# Patient Record
Sex: Male | Born: 1970 | Race: Black or African American | Hispanic: No | Smoking: Current every day smoker
Health system: Southern US, Community
[De-identification: ages and names within clinical notes are randomized; demographics above are authoritative.]

## PROBLEM LIST (undated history)

## (undated) DIAGNOSIS — K219 Gastro-esophageal reflux disease without esophagitis: Secondary | ICD-10-CM

## (undated) HISTORY — DX: Gastro-esophageal reflux disease without esophagitis: K21.9

## (undated) HISTORY — PX: NO PAST SURGERIES: SHX2092

---

## 1998-09-23 ENCOUNTER — Emergency Department (HOSPITAL_COMMUNITY): Admission: EM | Admit: 1998-09-23 | Discharge: 1998-09-23 | Payer: Self-pay | Admitting: Emergency Medicine

## 2006-05-06 ENCOUNTER — Ambulatory Visit: Payer: Self-pay | Admitting: Internal Medicine

## 2006-05-23 ENCOUNTER — Ambulatory Visit: Payer: Self-pay | Admitting: Gastroenterology

## 2006-06-22 ENCOUNTER — Ambulatory Visit: Payer: Self-pay | Admitting: Internal Medicine

## 2006-06-22 LAB — CBC & DIFF AND RETIC
BASO%: 1.5 % (ref 0.0–2.0)
Eosinophils Absolute: 0.3 10*3/uL (ref 0.0–0.5)
HCT: 43.6 % (ref 38.7–49.9)
LYMPH%: 43.7 % (ref 14.0–48.0)
MCHC: 34.4 g/dL (ref 32.0–35.9)
MCV: 74.4 fL — ABNORMAL LOW (ref 81.6–98.0)
MONO#: 0.5 10*3/uL (ref 0.1–0.9)
NEUT%: 46.4 % (ref 40.0–75.0)
Platelets: 282 10*3/uL (ref 145–400)
WBC: 8.9 10*3/uL (ref 4.0–10.0)

## 2006-06-24 LAB — HEMOGLOBINOPATHY EVALUATION
Hemoglobin Other: 0 % (ref 0.0–0.0)
Hgb A2 Quant: 2.8 % (ref 2.2–3.2)
Hgb F Quant: 0 % (ref 0.0–0.5)

## 2006-06-24 LAB — COMPREHENSIVE METABOLIC PANEL
Albumin: 4.5 g/dL (ref 3.5–5.2)
CO2: 25 mEq/L (ref 19–32)
Glucose, Bld: 96 mg/dL (ref 70–99)
Potassium: 3.9 mEq/L (ref 3.5–5.3)
Sodium: 142 mEq/L (ref 135–145)
Total Protein: 7.2 g/dL (ref 6.0–8.3)

## 2006-06-24 LAB — LACTATE DEHYDROGENASE: LDH: 114 U/L (ref 94–250)

## 2006-06-24 LAB — PROTEIN ELECTROPHORESIS, SERUM
Alpha-1-Globulin: 3.4 % (ref 2.9–4.9)
Alpha-2-Globulin: 10.4 % (ref 7.1–11.8)
Beta 2: 3.6 % (ref 3.2–6.5)
Gamma Globulin: 16.8 % (ref 11.1–18.8)

## 2006-06-24 LAB — IRON AND TIBC: Iron: 83 ug/dL (ref 42–165)

## 2009-03-24 ENCOUNTER — Emergency Department (HOSPITAL_COMMUNITY): Admission: EM | Admit: 2009-03-24 | Discharge: 2009-03-24 | Payer: Self-pay | Admitting: Emergency Medicine

## 2010-07-10 NOTE — Letter (Signed)
May 23, 2006     RE:  Earl, Levine  MRN:  [id number]  /  DOB:  [birth year]   Dear Mr. Earl Levine:   It is my pleasure to have treated you recently as a new patient in my  office.  I appreciate your confidence and the opportunity to participate  in your care.   Since I do have a busy inpatient endoscopy schedule and office schedule,  my office hours vary weekly.  I am, however, available for emergency  calls every day through my office.  If I cannot promptly meet an urgent  office appointment, another one of our gastroenterologists will be able  to assist you.   My well-trained staff are prepared to help you at all times.  For  emergencies after office hours, a physician from our gastroenterology  section is always available through my 24-hour answering service.   While you are under my care, I encourage discussion of your questions  and concerns, and I will be happy to return your calls as soon as I am  available.   Once again, I welcome you as a new patient and I look forward to a happy  and healthy relationship.    Sincerely,      Barbette Hair. [name], MD,FACG  Electronically Signed    RDK/MedQ  DD: 05/23/2006  DT: 05/23/2006  Job #: [id number]

## 2010-07-10 NOTE — Assessment & Plan Note (Signed)
[facility]                         GASTROENTEROLOGY OFFICE NOTE   Earl Levine                     MRN:          [id number]  DATE:05/23/2006                            DOB:          [birth year]    PROBLEM:  Abdominal pain.   Mr. Earl Levine is a 40 year old African male referred through the  courtesy of Dr. Alwyn Levine for evaluation.  About 4-6 weeks ago he had  burning abdominal pain both in the left and right upper quadrants.  He  was treated with AcipHex with some improvement.  Symptoms have recurred  since stopping his AcipHex.  Lab work on April 16, 2006, was  pertinent for amylase of 127 and lipase of 108.  Liver tests were  normal.  Abdominal ultrasound demonstrated a gallbladder polyp only.  He  continues to complain of burning discomfort in the left and right upper  quadrants.  It is unrelated to eating.  He is on no gastric irritants  including nonsteroidals or alcohol.  Urinalysis performed in the past  was normal.  Repeat amylase and lipase on May 07, 2006, were normal.   PAST MEDICAL HISTORY:  Unremarkable.   FAMILY HISTORY:  Noncontributory.   MEDICATIONS:  His only medication is Flexeril.  He has no known  allergies.   He does smoke, he does not drink.  He is single and works at [employer].   REVIEW OF SYSTEMS:  Was reviewed and is negative.   PHYSICAL EXAMINATION:  VITAL SIGNS:  Pulse 76 blood pressure 118/82,  weight 234.  HEENT: EOMI. PERRLA. Sclerae are anicteric.  Conjunctivae are pink.  NECK:  Supple without thyromegaly, adenopathy or carotid bruits.  CHEST:  Clear to auscultation and percussion without adventitious  sounds.  CARDIAC:  Regular rhythm; normal S1 S2.  There are no murmurs, gallops  or rubs.  ABDOMEN:  Bowel sounds are normoactive.  Abdomen is soft, non-tender and  non-distended.  There are no abdominal masses, tenderness, splenic  enlargement or hepatomegaly.  EXTREMITIES:  Full range of  motion.  No cyanosis, clubbing or edema.  RECTAL:  Deferred.   IMPRESSION:  Nonspecific upper abdominal pain (the patient is limited  historian).  History of elevated amylase and lipase raising the question  of pancreatitis.  He could have ulcer and non-ulcer dyspepsia and  possibly a posterior penetrating ulcer causing his pancreatic enzyme  elevation.   RECOMMENDATION:  Upper endoscopy.     Barbette Hair. [name], MD,FACG  Electronically Signed    RDK/MedQ  DD: 05/23/2006  DT: 05/23/2006  Job #: [id number]   cc:   Earl Najjar, MD

## 2010-07-10 NOTE — Letter (Signed)
May 23, 2006    Dr. Alwyn Ren  [facility]   RE:  DAMAIN, BROADUS  MRN:  [id number]  /  DOB:  01-13-1971   Dear Dr. Alwyn Ren:   Upon your kind referral, I had the pleasure of evaluating your patient  and I am pleased to offer my findings.  I saw Earl Levine in the  office today.  Enclosed is a copy of my progress note that details my  findings and recommendations.   Thank you for the opportunity to participate in your patient's care.    Sincerely,      Barbette Hair. [name], MD,FACG  Electronically Signed    RDK/MedQ  DD: 05/23/2006  DT: 05/23/2006  Job #: [id number]

## 2010-08-16 ENCOUNTER — Inpatient Hospital Stay (INDEPENDENT_AMBULATORY_CARE_PROVIDER_SITE_OTHER)
Admission: RE | Admit: 2010-08-16 | Discharge: 2010-08-16 | Disposition: A | Discharge status: Home / Self Care | Payer: Self-pay | Source: Ambulatory Visit | Attending: Family Medicine | Admitting: Family Medicine

## 2010-08-16 DIAGNOSIS — M65849 Other synovitis and tenosynovitis, unspecified hand: Secondary | ICD-10-CM

## 2012-09-12 ENCOUNTER — Emergency Department (HOSPITAL_COMMUNITY)
Admission: EM | Admit: 2012-09-12 | Discharge: 2012-09-12 | Disposition: A | Discharge status: Home / Self Care | Payer: 59 | Source: Home / Self Care | Attending: Emergency Medicine | Admitting: Emergency Medicine

## 2012-09-12 ENCOUNTER — Encounter (HOSPITAL_COMMUNITY): Payer: Self-pay | Admitting: Emergency Medicine

## 2012-09-12 DIAGNOSIS — M533 Sacrococcygeal disorders, not elsewhere classified: Secondary | ICD-10-CM

## 2012-09-12 DIAGNOSIS — N4281 Prostatodynia syndrome: Secondary | ICD-10-CM

## 2012-09-12 DIAGNOSIS — N4289 Other specified disorders of prostate: Secondary | ICD-10-CM

## 2012-09-12 LAB — CBC WITH DIFFERENTIAL/PLATELET
Basophils Absolute: 0.1 10*3/uL (ref 0.0–0.1)
Basophils Relative: 1 % (ref 0–1)
Eosinophils Absolute: 0.3 10*3/uL (ref 0.0–0.7)
Lymphs Abs: 3.2 10*3/uL (ref 0.7–4.0)
MCH: 25.8 pg — ABNORMAL LOW (ref 26.0–34.0)
Neutrophils Relative %: 55 % (ref 43–77)
Platelets: 256 10*3/uL (ref 150–400)
RBC: 5.77 MIL/uL (ref 4.22–5.81)
RDW: 15.1 % (ref 11.5–15.5)

## 2012-09-12 LAB — POCT URINALYSIS DIP (DEVICE)
Glucose, UA: NEGATIVE mg/dL
Nitrite: NEGATIVE
Urobilinogen, UA: 0.2 mg/dL (ref 0.0–1.0)

## 2012-09-12 MED ORDER — NAPROXEN 500 MG PO TABS: 500.0000 mg | ORAL_TABLET | Freq: Two times a day (BID) | ORAL | Status: DC

## 2012-09-12 MED ORDER — CEFDINIR 300 MG PO CAPS: 300.0000 mg | ORAL_CAPSULE | Freq: Two times a day (BID) | ORAL | Status: DC

## 2012-09-12 MED ORDER — METRONIDAZOLE 500 MG PO TABS: 500.0000 mg | ORAL_TABLET | Freq: Two times a day (BID) | ORAL | Status: DC

## 2012-09-12 NOTE — ED Notes (Signed)
Pt c/o tailbone pain onset 1100... Reports he went to sleep around 0600 today and woke up w/pain... Denies inj/trauma, fevers... States he works 2nd shift that doesn't require much physical activity... He is alert and oriented w/no signs of acute distress.

## 2012-09-12 NOTE — ED Provider Notes (Signed)
History    CSN: [id number] Arrival date & time 09/12/12  1525  First MD Initiated Contact with Patient 09/12/12 1547     Chief Complaint  Patient presents with  . Tailbone Pain   (Consider location/radiation/quality/duration/timing/severity/associated sxs/prior Treatment) HPI Comments: 42 year old male presents complaining of acute onset of pain in his tailbone. He also admits to subjective fever. He states that this woke him up around 11:00 this morning, he had no pain whatsoever when he went to sleep. The pain is exacerbated with any movement and by sitting. He has never experienced this before. No hematuria, dysuria, constipation, NVD, history of kidney stones  History reviewed. No pertinent past medical history. History reviewed. No pertinent past surgical history. No family history on file. History  Substance Use Topics  . Smoking status: Current Every Day Smoker    Types: Cigarettes  . Smokeless tobacco: Not on file  . Alcohol Use: No    Review of Systems  Constitutional: Negative for fever, chills and fatigue.  HENT: Negative for sore throat, neck pain and neck stiffness.   Eyes: Negative for visual disturbance.  Respiratory: Negative for cough and shortness of breath.   Cardiovascular: Negative for chest pain, palpitations and leg swelling.  Gastrointestinal: Negative for nausea, vomiting, abdominal pain, diarrhea and constipation.  Genitourinary: Negative for dysuria, urgency, frequency and hematuria.  Musculoskeletal: Positive for back pain (bottom ofcoccyx). Negative for myalgias and arthralgias.  Skin: Negative for rash.  Neurological: Negative for dizziness, weakness and light-headedness.    Allergies  Review of patient's allergies indicates no known allergies.  Home Medications   Current Outpatient Rx  Name  Route  Sig  Dispense  Refill  . cefdinir (OMNICEF) 300 MG capsule   Oral   Take 1 capsule (300 mg total) by mouth 2 (two) times daily.   28  capsule   0   . metroNIDAZOLE (FLAGYL) 500 MG tablet   Oral   Take 1 tablet (500 mg total) by mouth 2 (two) times daily.   28 tablet   0   . naproxen (NAPROSYN) 500 MG tablet   Oral   Take 1 tablet (500 mg total) by mouth 2 (two) times daily.   60 tablet   0    BP 135/76  Pulse 68  Temp(Src) 99.3 F (37.4 C) (Oral)  Resp 18  SpO2 100% Physical Exam  Constitutional: He is oriented to person, place, and time. He appears well-developed and well-nourished. No distress.  HENT:  Head: Normocephalic and atraumatic.  Eyes: EOM are normal. Pupils are equal, round, and reactive to light.  Cardiovascular: Normal rate and regular rhythm.  Exam reveals no gallop and no friction rub.   No murmur heard. Pulmonary/Chest: Effort normal and breath sounds normal. No respiratory distress. He has no wheezes. He has no rales.  Abdominal: Soft. There is no tenderness.  Genitourinary: Rectal exam shows tenderness (when pressing against coccyx, also mild pain with pressing against prostate). Rectal exam shows no mass and anal tone normal. Guaiac negative stool. Prostate is enlarged (firm, enlarged.  ) and tender.  Neurological: He is oriented to person, place, and time.  Skin: Skin is warm and dry. No rash noted.  Psychiatric: He has a normal mood and affect. Judgment normal.    ED Course  Procedures (including critical care time) Labs Reviewed  POCT URINALYSIS DIP (DEVICE) - Abnormal; Notable for the following:    Hgb urine dipstick TRACE (*)    All other components within normal limits  OCCULT BLOOD X 1 CARD TO LAB, STOOL  CBC WITH DIFFERENTIAL  PSA   No results found. 1. Coccyx pain   2. Prostatodynia     MDM  No obvious cause of this patient's symptoms. This may be prostatitis, early abscess or pilonidal cyst, or musculoskeletal pain. We'll cover for both of these and have him followup in 48 hours if he is not improving. Also sending CBC and PSA   Meds ordered this encounter   Medications  . naproxen (NAPROSYN) 500 MG tablet    Sig: Take 1 tablet (500 mg total) by mouth 2 (two) times daily.    Dispense:  60 tablet    Refill:  0  . metroNIDAZOLE (FLAGYL) 500 MG tablet    Sig: Take 1 tablet (500 mg total) by mouth 2 (two) times daily.    Dispense:  28 tablet    Refill:  0  . cefdinir (OMNICEF) 300 MG capsule    Sig: Take 1 capsule (300 mg total) by mouth 2 (two) times daily.    Dispense:  28 capsule    Refill:  0     Graylon Good, PA-C 09/12/12 1701

## 2012-09-13 LAB — OCCULT BLOOD, POC DEVICE: Fecal Occult Bld: NEGATIVE

## 2012-09-13 LAB — PSA: PSA: 0.41 ng/mL (ref <=4.00)

## 2012-09-13 NOTE — ED Provider Notes (Signed)
Medical screening examination/treatment/procedure(s) were performed by non-physician practitioner and as supervising physician I was immediately available for consultation/collaboration.  Raynald Blend, MD 09/13/12 [phone]

## 2013-06-04 ENCOUNTER — Telehealth: Payer: Self-pay

## 2013-06-04 NOTE — Telephone Encounter (Signed)
Left message for call back Non identifiable  New Patient PSA--08/2012--0.41

## 2013-06-05 ENCOUNTER — Telehealth: Payer: Self-pay | Admitting: Internal Medicine

## 2013-06-05 ENCOUNTER — Ambulatory Visit (INDEPENDENT_AMBULATORY_CARE_PROVIDER_SITE_OTHER): Payer: 59 | Admitting: Internal Medicine

## 2013-06-05 ENCOUNTER — Encounter: Payer: Self-pay | Admitting: Internal Medicine

## 2013-06-05 VITALS — BP 131/86 | HR 71 | Temp 98.1°F | Ht 71.2 in | Wt 245.0 lb

## 2013-06-05 DIAGNOSIS — K219 Gastro-esophageal reflux disease without esophagitis: Secondary | ICD-10-CM

## 2013-06-05 DIAGNOSIS — Z0000 Encounter for general adult medical examination without abnormal findings: Secondary | ICD-10-CM

## 2013-06-05 MED ORDER — KETOCONAZOLE 2 % EX CREA: 1.0000 "application " | TOPICAL_CREAM | Freq: Two times a day (BID) | CUTANEOUS | Status: DC

## 2013-06-05 MED ORDER — OMEPRAZOLE 40 MG PO CPDR: 40.0000 mg | DELAYED_RELEASE_CAPSULE | Freq: Every day | ORAL | Status: DC

## 2013-06-05 NOTE — Patient Instructions (Signed)
Get your blood work before you leave   Use omeprazole 40 mg one tablet before breakfast for one month then as needed if you have heartburn. If no better let me know  Use the cream between the toes for 2 weeks, keep the toes dry and clean with powder If not better let me know  Next visit is for a physical exam in 1 year, fasting Please make an appointment        Diet for Gastroesophageal Reflux Disease, Adult Reflux (acid reflux) is when acid from your stomach flows up into the esophagus. When acid comes in contact with the esophagus, the acid causes irritation and soreness (inflammation) in the esophagus. When reflux happens often or so severely that it causes damage to the esophagus, it is called gastroesophageal reflux disease (GERD). Nutrition therapy can help ease the discomfort of GERD. FOODS OR DRINKS TO AVOID OR LIMIT  Smoking or chewing tobacco. Nicotine is one of the most potent stimulants to acid production in the gastrointestinal tract.  Caffeinated and decaffeinated coffee and black tea.  Regular or low-calorie carbonated beverages or energy drinks (caffeine-free carbonated beverages are allowed).   Strong spices, such as black pepper, white pepper, red pepper, cayenne, curry powder, and chili powder.  Peppermint or spearmint.  Chocolate.  High-fat foods, including meats and fried foods. Extra added fats including oils, butter, salad dressings, and nuts. Limit these to less than 8 tsp per day.  Fruits and vegetables if they are not tolerated, such as citrus fruits or tomatoes.  Alcohol.  Any food that seems to aggravate your condition. If you have questions regarding your diet, call your caregiver or a registered dietitian. OTHER THINGS THAT MAY HELP GERD INCLUDE:   Eating your meals slowly, in a relaxed setting.  Eating 5 to 6 small meals per day instead of 3 large meals.  Eliminating food for a period of time if it causes distress.  Not lying down until 3  hours after eating a meal.  Keeping the head of your bed raised 6 to 9 inches (15 to 23 cm) by using a foam wedge or blocks under the legs of the bed. Lying flat may make symptoms worse.  Being physically active. Weight loss may be helpful in reducing reflux in overweight or obese adults.  Wear loose fitting clothing EXAMPLE MEAL PLAN This meal plan is approximately 2,000 calories based on https://www.bernard.org/ChooseMyPlate.gov meal planning guidelines. Breakfast   cup cooked oatmeal.  1 cup strawberries.  1 cup low-fat milk.  1 oz almonds. Snack  1 cup cucumber slices.  6 oz yogurt (made from low-fat or fat-free milk). Lunch  2 slice whole-wheat bread.  2 oz sliced [place]urkey.  2 tsp mayonnaise.  1 cup blueberries.  1 cup snap peas. Snack  6 whole-wheat crackers.  1 oz string cheese. Dinner   cup brown rice.  1 cup mixed veggies.  1 tsp olive oil.  3 oz grilled fish. Document Released: 02/08/2005 Document Revised: 05/03/2011 Document Reviewed: 12/25/2010 [facility] Patient Information 2014 [place], [place].   Testicular Self-Exam A self-examination of your testicles involves looking at and feeling your testicles for abnormal lumps or swelling. Several things can cause swelling, lumps, or pain in your testicles. Some of these causes are:  Injuries.  Inflammation.  Infection.  Accumulation of fluids around your testicle (hydrocele).  Twisted testicles (testicular torsion).  Testicular cancer. Self-examination of the testicles and groin areas may be advised if you are at risk for testicular cancer. Risks for  testicular cancer include:  An undescended testicle (cryptorchidism).  A history of previous testicular cancer.  A family history of testicular cancer. The testicles are easiest to examine after warm baths or showers and are more difficult to examine when you are cold. This is because the muscles attached to the testicles retract and pull them up higher or into the  abdomen. Follow these steps while you are standing:  Hold your penis away from your body.  Roll one testicle between your thumb and forefinger, feeling the entire testicle.  Roll the other testicle between your thumb and forefinger, feeling the entire testicle. Feel for lumps, swelling, or discomfort. A normal testicle is egg shaped and feels firm. It is smooth and not tender. The spermatic cord can be felt as a firm spaghetti-like cord at the back of your testicle. It is also important to examine the crease between the front of your leg and your abdomen. Feel for any bumps that are tender. These could be enlarged lymph nodes.  Document Released: 05/17/2000 Document Revised: 10/11/2012 Document Reviewed: 07/31/2012 [facility] Patient Information 2014 [place], [place].   Smoking Cessation Quitting smoking is important to your health and has many advantages. However, it is not always easy to quit since nicotine is a very addictive drug. Often times, people try 3 times or more before being able to quit. This document explains the best ways for you to prepare to quit smoking. Quitting takes hard work and a lot of effort, but you can do it. ADVANTAGES OF QUITTING SMOKING  You will live longer, feel better, and live better.  Your body will feel the impact of quitting smoking almost immediately.  Within 20 minutes, blood pressure decreases. Your pulse returns to its normal level.  After 8 hours, carbon monoxide levels in the blood return to normal. Your oxygen level increases.  After 24 hours, the chance of having a heart attack starts to decrease. Your breath, hair, and body stop smelling like smoke.  After 48 hours, damaged nerve endings begin to recover. Your sense of taste and smell improve.  After 72 hours, the body is virtually free of nicotine. Your bronchial tubes relax and breathing becomes easier.  After 2 to 12 weeks, lungs can hold more air. Exercise becomes easier and circulation  improves.  The risk of having a heart attack, stroke, cancer, or lung disease is greatly reduced.  After 1 year, the risk of coronary heart disease is cut in half.  After 5 years, the risk of stroke falls to the same as a nonsmoker.  After 10 years, the risk of lung cancer is cut in half and the risk of other cancers decreases significantly.  After 15 years, the risk of coronary heart disease drops, usually to the level of a nonsmoker.  If you are pregnant, quitting smoking will improve your chances of having a healthy baby.  The people you live with, especially any children, will be healthier.  You will have extra money to spend on things other than cigarettes. QUESTIONS TO THINK ABOUT BEFORE ATTEMPTING TO QUIT You may want to talk about your answers with your caregiver.  Why do you want to quit?  If you tried to quit in the past, what helped and what did not?  What will be the most difficult situations for you after you quit? How will you plan to handle them?  Who can help you through the tough times? Your family? Friends? A caregiver?  What pleasures do you get from  smoking? What ways can you still get pleasure if you quit? Here are some questions to ask your caregiver:  How can you help me to be successful at quitting?  What medicine do you think would be best for me and how should I take it?  What should I do if I need more help?  What is smoking withdrawal like? How can I get information on withdrawal? GET READY  Set a quit date.  Change your environment by getting rid of all cigarettes, ashtrays, matches, and lighters in your home, car, or work. Do not let people smoke in your home.  Review your past attempts to quit. Think about what worked and what did not. GET SUPPORT AND ENCOURAGEMENT You have a better chance of being successful if you have help. You can get support in many ways.  Tell your family, friends, and co-workers that you are going to quit and need  their support. Ask them not to smoke around you.  Get individual, group, or telephone counseling and support. Programs are available at [facility]. Call your local health department for information about programs in your area.  Spiritual beliefs and practices may help some smokers quit.  Download a "quit meter" on your computer to keep track of quit statistics, such as how long you have gone without smoking, cigarettes not smoked, and money saved.  Get a self-help book about quitting smoking and staying off of tobacco. LEARN NEW SKILLS AND BEHAVIORS  Distract yourself from urges to smoke. Talk to someone, go for a walk, or occupy your time with a task.  Change your normal routine. Take a different route to work. Drink tea instead of coffee. Eat breakfast in a different place.  Reduce your stress. Take a hot bath, exercise, or read a book.  Plan something enjoyable to do every day. Reward yourself for not smoking.  Explore interactive web-based programs that specialize in helping you quit. GET MEDICINE AND USE IT CORRECTLY Medicines can help you stop smoking and decrease the urge to smoke. Combining medicine with the above behavioral methods and support can greatly increase your chances of successfully quitting smoking.  Nicotine replacement therapy helps deliver nicotine to your body without the negative effects and risks of smoking. Nicotine replacement therapy includes nicotine gum, lozenges, inhalers, nasal sprays, and skin patches. Some may be available over-the-counter and others require a prescription.  Antidepressant medicine helps people abstain from smoking, but how this works is unknown. This medicine is available by prescription.  Nicotinic receptor partial agonist medicine simulates the effect of nicotine in your brain. This medicine is available by prescription. Ask your caregiver for advice about which medicines to use and how to use them based on  your health history. Your caregiver will tell you what side effects to look out for if you choose to be on a medicine or therapy. Carefully read the information on the package. Do not use any other product containing nicotine while using a nicotine replacement product.  RELAPSE OR DIFFICULT SITUATIONS Most relapses occur within the first 3 months after quitting. Do not be discouraged if you start smoking again. Remember, most people try several times before finally quitting. You may have symptoms of withdrawal because your body is used to nicotine. You may crave cigarettes, be irritable, feel very hungry, cough often, get headaches, or have difficulty concentrating. The withdrawal symptoms are only temporary. They are strongest when you first quit, but they will go away within 10 14  days. To reduce the chances of relapse, try to:  Avoid drinking alcohol. Drinking lowers your chances of successfully quitting.  Reduce the amount of caffeine you consume. Once you quit smoking, the amount of caffeine in your body increases and can give you symptoms, such as a rapid heartbeat, sweating, and anxiety.  Avoid smokers because they can make you want to smoke.  Do not let weight gain distract you. Many smokers will gain weight when they quit, usually less than 10 pounds. Eat a healthy diet and stay active. You can always lose the weight gained after you quit.  Find ways to improve your mood other than smoking. FOR MORE INFORMATION  www.smokefree.gov  Document Released: 02/02/2001 Document Revised: 08/10/2011 Document Reviewed: 05/20/2011 [facility] Patient Information 2014 [place], [place].   Safe Sex Safe sex is about reducing the risk of giving or getting a sexually transmitted disease (STD). STDs are spread through sexual contact involving the genitals, mouth, or rectum. Some STDS can be cured and others cannot. Safe sex can also prevent unintended pregnancies.  SAFE SEX PRACTICES  Limit your sexual  activity to only one partner who is only having sex with you.  Talk to your partner about their past partners, past STDs, and drug use.  Use a condom every time you have sexual intercourse. This includes vaginal, oral, and anal sexual activity. Both females and males should wear condoms during oral sex. Only use latex or polyurethane condoms and water-based lubricants. Petroleum-based lubricants or oils used to lubricate a condom will weaken the condom and increase the chance that it will break. The condom should be in place from the beginning to the end of sexual activity. Wearing a condom reduces, but does not completely eliminate, your risk of getting or giving a STD. STDs can be spread by contact with skin of surrounding areas.  Get vaccinated for hepatitis B and HPV.  Avoid alcohol and recreational drugs which can affect your judgement. You may forget to use a condom or participate in high-risk sex.  For females, avoid douching after sexual intercourse. Douching can spread an infection farther into the reproductive tract.  Check your body for signs of sores, blisters, rashes, or unusual discharge. See your caregiver if you notice any of these signs.  Avoid sexual contact if you have symptoms of an infection or are being treated for an STD. If you or your partner has herpes, avoid sexual contact when blisters are present. Use condoms at all other times.  See your caregiver for regular screenings, examinations, and tests for STDs. Before having sex with a new partner, each of you should be screened for STDs and talk about the results with your partner. BENEFITS OF SAFE SEX   There is less of a chance of getting or giving an STD.  You can prevent unwanted or unintended pregnancies.  By discussing safer sex concerns with your partner, you may increase feelings of intimacy, comfort, trust, and honesty between the both of you. Document Released: 03/18/2004 Document Revised: 11/03/2011 Document  Reviewed: 08/02/2011 [facility] Patient Information 2014 [place], [place].

## 2013-06-05 NOTE — Assessment & Plan Note (Addendum)
Complains of heartburn, not better with Zantac . Plan: Omeprazole daily for 4 weeks then as needed. Precautions discussed

## 2013-06-05 NOTE — Progress Notes (Signed)
Pre visit review using our clinic review tool, if applicable. No additional management support is needed unless otherwise documented below in the visit note. 

## 2013-06-05 NOTE — Telephone Encounter (Signed)
Relevant patient education mailed to patient.  

## 2013-06-05 NOTE — Progress Notes (Signed)
   Subjective:    Patient ID: Earl Levine, male    DOB: 01/26/1971, 43 y.o.   MRN: [id number]  DOS:  06/05/2013 Type of  visit: New patient, request a CPX. Also h/o heartburn for a while, Zantac not helping. Denies dysphagia or odynophagia. Has a itchy rash between the toes, OTC powder not helping.   ROS Diet-- eats fast food  Exercise-- no, sedentary  No  CP, SOB No palpitations, no lower extremity edema  Denies  nausea, vomiting diarrhea Denies  blood in the stools (-) cough, sputum production (-) wheezing, chest congestion  No dysuria, gross hematuria, difficulty urinating  No  penile discharge No anxiety, depression    Past Medical History  Diagnosis Date  . GERD (gastroesophageal reflux disease)     Past Surgical History  Procedure Laterality Date  . No past surgeries      History   Social History  . Marital Status: Divorced    Spouse Name: N/A    Number of Children: 0  . Years of Education: N/A   Occupational History  . works in a factory    Social History Main Topics  . Smoking status: Current Every Day Smoker -- 1.00 packs/day    Types: Cigarettes  . Smokeless tobacco: Never Used     Comment: 1/2 ppd  . Alcohol Use: No  . Drug Use: No  . Sexual Activity: Not on file   Other Topics Concern  . Not on file   Social History Narrative   Lives by himself   [place] Moved to the [place] 1999     Family History  Problem Relation Age of Onset  . Colon cancer Neg Hx   . Prostate cancer Neg Hx   . CAD Neg Hx   . Diabetes Neg Hx   . Hypertension Neg Hx       Medication List       This list is accurate as of: 06/05/13  5:39 PM.  Always use your most recent med list.               ketoconazole 2 % cream  Commonly known as:  NIZORAL  Apply 1 application topically 2 (two) times daily.     omeprazole 40 MG capsule  Commonly known as:  PRILOSEC  Take 1 capsule (40 mg total) by mouth daily.           Objective:   Physical  Exam BP 131/86  Pulse 71  Temp(Src) 98.1 F (36.7 C)  Ht 5' 11.2" (1.808 m)  Wt 245 lb (111.131 kg)  BMI 34.00 kg/m2  SpO2 95% General -- alert, well-developed, NAD.  Neck --no thyromegaly , normal carotid pulse  HEENT-- Not pale.  Lungs -- normal respiratory effort, no intercostal retractions, no accessory muscle use, and normal breath sounds.  Heart-- normal rate, regular rhythm, no murmur.  Abdomen-- Not distended, good bowel sounds,soft, non-tender.  Extremities-- no pretibial edema bilaterally ; maceration between the toes mostly between the fourth and fifth toes bilaterally. Neurologic--  alert & oriented X3. Speech normal, gait normal, strength normal in all extremities.   Psych-- Cognition and judgment appear intact. Cooperative with normal attention span and concentration. No anxious or depressed appearing.      Assessment & Plan:

## 2013-06-05 NOTE — Telephone Encounter (Signed)
Unable to reach prior to visit  

## 2013-06-05 NOTE — Assessment & Plan Note (Addendum)
Td 2011 per pt  Never had a colonoscopy Counseled about diet, exercise, quitting tobacco, STE, safe sex Labs Has a fungal dermatitis, prescribed Nizoral

## 2013-06-06 LAB — COMPREHENSIVE METABOLIC PANEL
ALBUMIN: 4.1 g/dL (ref 3.5–5.2)
ALT: 29 U/L (ref 0–53)
AST: 26 U/L (ref 0–37)
Alkaline Phosphatase: 45 U/L (ref 39–117)
BUN: 13 mg/dL (ref 6–23)
CALCIUM: 9.4 mg/dL (ref 8.4–10.5)
CO2: 27 meq/L (ref 19–32)
Chloride: 104 mEq/L (ref 96–112)
Creatinine, Ser: 1 mg/dL (ref 0.4–1.5)
GFR: 101.75 mL/min (ref >60.00)
GLUCOSE: 92 mg/dL (ref 70–99)
POTASSIUM: 3.7 meq/L (ref 3.5–5.1)
SODIUM: 139 meq/L (ref 135–145)
TOTAL PROTEIN: 7.2 g/dL (ref 6.0–8.3)
Total Bilirubin: 1 mg/dL (ref 0.3–1.2)

## 2013-06-06 LAB — CBC WITH DIFFERENTIAL/PLATELET
Basophils Absolute: 0 10*3/uL (ref 0.0–0.1)
Basophils Relative: 0.5 % (ref 0.0–3.0)
EOS PCT: 4.7 % (ref 0.0–5.0)
Eosinophils Absolute: 0.4 10*3/uL (ref 0.0–0.7)
HCT: 45.9 % (ref 39.0–52.0)
Hemoglobin: 15 g/dL (ref 13.0–17.0)
LYMPHS PCT: 47.9 % — AB (ref 12.0–46.0)
Lymphs Abs: 4.4 10*3/uL — ABNORMAL HIGH (ref 0.7–4.0)
MCHC: 32.7 g/dL (ref 30.0–36.0)
MCV: 76.7 fl — ABNORMAL LOW (ref 78.0–100.0)
MONO ABS: 0.8 10*3/uL (ref 0.1–1.0)
MONOS PCT: 8.7 % (ref 3.0–12.0)
NEUTROS PCT: 38.2 % — AB (ref 43.0–77.0)
Neutro Abs: 3.5 10*3/uL (ref 1.4–7.7)
PLATELETS: 285 10*3/uL (ref 150.0–400.0)
RBC: 5.99 Mil/uL — AB (ref 4.22–5.81)
RDW: 15.7 % — ABNORMAL HIGH (ref 11.5–14.6)
WBC: 9.3 10*3/uL (ref 4.5–10.5)

## 2013-06-06 LAB — LIPID PANEL
CHOLESTEROL: 182 mg/dL (ref 0–200)
HDL: 30.7 mg/dL — AB (ref >39.00)
LDL Cholesterol: 117 mg/dL — ABNORMAL HIGH (ref 0–99)
TRIGLYCERIDES: 174 mg/dL — AB (ref 0.0–149.0)
Total CHOL/HDL Ratio: 6
VLDL: 34.8 mg/dL (ref 0.0–40.0)

## 2013-06-06 LAB — TSH: TSH: 1.61 u[IU]/mL (ref 0.35–5.50)

## 2013-06-07 ENCOUNTER — Encounter: Payer: Self-pay | Admitting: *Deleted

## 2013-12-22 ENCOUNTER — Ambulatory Visit (INDEPENDENT_AMBULATORY_CARE_PROVIDER_SITE_OTHER): Payer: 59 | Admitting: Family Medicine

## 2013-12-22 VITALS — BP 150/103 | HR 69 | Temp 97.4°F | Resp 20 | Ht 70.5 in | Wt 226.5 lb

## 2013-12-22 DIAGNOSIS — K219 Gastro-esophageal reflux disease without esophagitis: Secondary | ICD-10-CM

## 2013-12-22 DIAGNOSIS — B356 Tinea cruris: Secondary | ICD-10-CM

## 2013-12-22 DIAGNOSIS — R030 Elevated blood-pressure reading, without diagnosis of hypertension: Secondary | ICD-10-CM

## 2013-12-22 MED ORDER — ESOMEPRAZOLE MAGNESIUM 40 MG PO CPDR: 40.0000 mg | DELAYED_RELEASE_CAPSULE | Freq: Every day | ORAL | Status: DC

## 2013-12-22 MED ORDER — TERBINAFINE HCL 250 MG PO TABS: 250.0000 mg | ORAL_TABLET | Freq: Every day | ORAL | Status: AC

## 2013-12-22 NOTE — Patient Instructions (Addendum)
Take terbinafine 250 mg 1 daily for the rash  Take Nexium 1 daily for the heartburn and reflux  If not improving with either the rash or the reflux these return as directed  Take Zyrtec (citirizine) over-the-counter 1 at bedtime to try and help reduce itching  Check her blood pressure periodically in a pharmacy and write down what you're getting. If you're running greater than 140/90 much of the time he should return to consider blood pressure treatment

## 2013-12-22 NOTE — Progress Notes (Signed)
Subjective: Patient is here for 2 issues. This is his first time here. For the last couple weeks he been having a rash in his groin region that itches a lot as well as on the scrotum and penis. Nose no STD exposures. He is single. He also been having heartburn intermittently. Dr. Drue NovelPaz treated him back in the spring with Prilosec and it did not seem to help a lot. He would like to try something different. It does not bother him all the time, but when he eats something real hot and spicy it sometimes triggers it.  Objective: Pleasant gentleman in no acute distress. Chest clear. Heart regular without murmurs. No masses tenderness. He has a little rash in the creases of his groin. His penis and scrotum have much more prominent maculo in areas rash which itch quite a lot.  Assessment: Tinea cruris Pruritus GERD  Plan: Terbinafine Nexium  If not improved may need an EGD. Also may need a skin biopsy. Return as directed.  He is instructed to monitor his blood pressure closely.

## 2013-12-23 ENCOUNTER — Ambulatory Visit (INDEPENDENT_AMBULATORY_CARE_PROVIDER_SITE_OTHER): Payer: 59 | Admitting: Family Medicine

## 2013-12-23 ENCOUNTER — Encounter: Payer: Self-pay | Admitting: Family Medicine

## 2013-12-23 VITALS — BP 146/80 | HR 76 | Temp 98.4°F | Resp 16 | Ht 71.0 in | Wt 228.0 lb

## 2013-12-23 DIAGNOSIS — L293 Anogenital pruritus, unspecified: Secondary | ICD-10-CM

## 2013-12-23 DIAGNOSIS — B356 Tinea cruris: Secondary | ICD-10-CM

## 2013-12-23 DIAGNOSIS — Z113 Encounter for screening for infections with a predominantly sexual mode of transmission: Secondary | ICD-10-CM

## 2013-12-23 LAB — POCT WET PREP WITH KOH
BACTERIA WET PREP HPF POC: NEGATIVE
CLUE CELLS WET PREP PER HPF POC: NEGATIVE
KOH Prep POC: NEGATIVE
RBC WET PREP PER HPF POC: NEGATIVE
Trichomonas, UA: NEGATIVE
WBC WET PREP PER HPF POC: NEGATIVE
Yeast Wet Prep HPF POC: NEGATIVE

## 2013-12-23 LAB — POCT CBC
GRANULOCYTE PERCENT: 60.5 % (ref 37–80)
HEMATOCRIT: 50.3 % (ref 43.5–53.7)
Hemoglobin: 15.9 g/dL (ref 14.1–18.1)
Lymph, poc: 3.3 (ref 0.6–3.4)
MCH, POC: 24.7 pg — AB (ref 27–31.2)
MCHC: 31.6 g/dL — AB (ref 31.8–35.4)
MCV: 78 fL — AB (ref 80–97)
MID (cbc): 0.9 (ref 0–0.9)
MPV: 7.3 fL (ref 0–99.8)
PLATELET COUNT, POC: 353 10*3/uL (ref 142–424)
POC GRANULOCYTE: 6.5 (ref 2–6.9)
POC LYMPH %: 31.2 % (ref 10–50)
POC MID %: 8.3 %M (ref 0–12)
RBC: 6.44 M/uL — AB (ref 4.69–6.13)
RDW, POC: 17.2 %
WBC: 10.7 10*3/uL — AB (ref 4.6–10.2)

## 2013-12-23 LAB — RPR

## 2013-12-23 LAB — HIV ANTIBODY (ROUTINE TESTING W REFLEX): HIV: NONREACTIVE

## 2013-12-23 MED ORDER — KETOCONAZOLE 2 % EX CREA: 1.0000 "application " | TOPICAL_CREAM | Freq: Three times a day (TID) | CUTANEOUS | Status: DC

## 2013-12-23 NOTE — Patient Instructions (Signed)
Jock Itch Jock itch is a fungal infection of the skin in the groin area. It is sometimes called "ringworm" even though it is not caused by a worm. A fungus is a type of germ that thrives in dark, damp places.  CAUSES  This infection may spread from:  A fungus infection elsewhere on the body (such as athlete's foot).  Sharing towels or clothing. This infection is more common in:  Hot, humid climates.  People who wear tight-fitting clothing or wet bathing suits for long periods of time.  Athletes.  Overweight people.  People with diabetes. SYMPTOMS  Jock itch causes the following symptoms:  Red, pink or brown rash in the groin. Rash may spread to the thighs, anus, and buttocks.  Itching. DIAGNOSIS  Your caregiver may make the diagnosis by looking at the rash. Sometimes a skin scraping will be sent to test for fungus. Testing can be done either by looking under the microscope or by doing a culture (test to try to grow the fungus). A culture can take up to 2 weeks to come back. TREATMENT  Jock itch may be treated with:  Skin cream or ointment to kill fungus.  Medicine by mouth to kill fungus.  Skin cream or ointment to calm the itching.  Compresses or medicated powders to dry the infected skin. HOME CARE INSTRUCTIONS   Be sure to treat the rash completely. Follow your caregiver's instructions. It can take a couple of weeks to treat. If you do not treat the infection long enough, the rash can come back.  Wear loose-fitting clothing.  Men should wear cotton boxer shorts.  Women should wear cotton underwear.  Avoid hot baths.  Dry the groin area well after bathing. SEEK MEDICAL CARE IF:   Your rash is worse.  Your rash is spreading.  Your rash returns after treatment is finished.  Your rash is not gone in 4 weeks. Fungal infections are slow to respond to treatment. Some redness may remain for several weeks after the fungus is gone. SEEK IMMEDIATE MEDICAL CARE  IF:  The area becomes red, warm, tender, and swollen.  You have a fever. Document Released: 01/29/2002 Document Revised: 05/03/2011 Document Reviewed: 12/29/2007 ExitCare Patient Information 2015 [employer]. This information is not intended to replace advice given to you by your health care provider. Make sure you discuss any questions you have with your health care provider.  

## 2013-12-23 NOTE — Progress Notes (Signed)
   Subjective:    Patient ID: Earl Levine, male    DOB: 11/09/1970, 43 y.o.   MRN: [id number] This chart was scribed for Earl SimmerKristi Talik Casique, MD by Jolene Provostobert Halas, Medical Scribe. This patient was seen in Room 12 and the patient's care was started at 4:57 PM.  HPI HPI Comments: Earl Levine is a 43 y.o. male who [facility] complaining of rash on genitals.  Evaluated by Dr. Alwyn RenHopper 72 hours ago; diagnosed with tinea cruris; prescribed Lamisil 250mg  daily orally.  No improvement in rash.  Now rash is diffusely involving genital region with drainage. No fever/chills/sweats.  No new sexual partners; married.  No history of STDs; agreeable to STD screening.  Rash is very uncomfortable with clothing.  Review of Systems  Constitutional: Negative for fever, chills, diaphoresis and fatigue.  Gastrointestinal: Negative for nausea and vomiting.  Genitourinary: Positive for genital sores. Negative for dysuria, urgency, frequency, hematuria, flank pain, decreased urine volume, discharge, penile swelling, scrotal swelling, enuresis, difficulty urinating, penile pain and testicular pain.  Skin: Positive for color change and rash.   Past Medical History  Diagnosis Date  . GERD (gastroesophageal reflux disease)    Past Surgical History  Procedure Laterality Date  . No past surgeries     No Known Allergies     Objective:   Physical Exam  Constitutional: He is oriented to person, place, and time. He appears well-developed and well-nourished.  HENT:  Head: Normocephalic and atraumatic.  Eyes: Pupils are equal, round, and reactive to light.  Neck: No JVD present.  Cardiovascular: Normal rate and regular rhythm.   Pulmonary/Chest: Effort normal and breath sounds normal. No respiratory distress.  Genitourinary:     Diffuse scaling erythematous rash of entire genital region including penile shaft, scrotum B.  Clear drainage from rash from fissures.    Neurological: He is alert and oriented to  person, place, and time.  Skin: Skin is warm and dry.  Psychiatric: He has a normal mood and affect. His behavior is normal.  Nursing note and vitals reviewed.      Assessment & Plan:  Itching of male genitalia - Plan: POCT Wet Prep with KOH, HSV(herpes simplex vrs) 1+2 ab-IgG, POCT CBC, RPR, HIV antibody, Herpes simplex virus culture  Screening for STD (sexually transmitted disease) - Plan: HIV antibody, Herpes simplex virus culture  Tinea cruris   1. Tinea Cruris: persistent; continue Lamisil po daily; add Ketoconazole cream tid.   2.  Itching male genitalia: persistent; obtain wet prep, GC/Chlam, HSV cx, RPR, HIV.   3. Screening STD: obtain GC/Chlam/RPR/HIV/HSV cx.   Meds ordered this encounter  Medications  . ketoconazole (NIZORAL) 2 % cream    Sig: Apply 1 application topically 3 (three) times daily.    Dispense:  60 g    Refill:  0   I personally performed the services described in this documentation, which was scribed in my presence.  The recorded information has been reviewed and is accurate.  Earl SimmerKristi Rahaf Carbonell, M.D.  [facility]  [address] [place], [place]  [id number] [phone] phone [phone] fax

## 2013-12-24 ENCOUNTER — Telehealth: Payer: Self-pay | Admitting: Family Medicine

## 2013-12-24 LAB — HSV(HERPES SIMPLEX VRS) I + II AB-IGG
HSV 1 GLYCOPROTEIN G AB, IGG: 11.19 IV — AB
HSV 2 Glycoprotein G Ab, IgG: 5.66 IV — ABNORMAL HIGH

## 2013-12-24 NOTE — Telephone Encounter (Signed)
[facility]

## 2013-12-24 NOTE — Telephone Encounter (Signed)
Source of HSV culture is penis.

## 2013-12-24 NOTE — Telephone Encounter (Signed)
Vikki PortsValerie with [facility] called and needed source for HSV CX. Please advise

## 2013-12-26 LAB — HERPES SIMPLEX VIRUS CULTURE: Organism ID, Bacteria: NOT DETECTED

## 2013-12-27 ENCOUNTER — Telehealth: Payer: Self-pay

## 2013-12-27 ENCOUNTER — Encounter: Payer: Self-pay | Admitting: Family Medicine

## 2013-12-27 NOTE — Telephone Encounter (Signed)
PA needed for nexium. Completed on covermymeds. Pending.

## 2013-12-28 NOTE — Telephone Encounter (Signed)
PA is denied - nexium is a plan exclusion and can not be covered. Dr Alwyn RenHopper, do you want to Rx something else? Pt had tried/failed omeprazole. Pantoprazole and lansoprazole are often covered. Do you want to try one of these?

## 2013-12-30 ENCOUNTER — Ambulatory Visit (INDEPENDENT_AMBULATORY_CARE_PROVIDER_SITE_OTHER): Payer: 59 | Admitting: Family Medicine

## 2013-12-30 VITALS — BP 132/90 | HR 83 | Temp 97.5°F | Resp 18 | Ht 70.75 in | Wt 230.4 lb

## 2013-12-30 DIAGNOSIS — R21 Rash and other nonspecific skin eruption: Secondary | ICD-10-CM

## 2013-12-30 DIAGNOSIS — N489 Disorder of penis, unspecified: Secondary | ICD-10-CM

## 2013-12-30 DIAGNOSIS — L299 Pruritus, unspecified: Secondary | ICD-10-CM

## 2013-12-30 DIAGNOSIS — R509 Fever, unspecified: Secondary | ICD-10-CM

## 2013-12-30 LAB — POCT CBC
Granulocyte percent: 46.9 %G (ref 37–80)
HEMATOCRIT: 45.1 % (ref 43.5–53.7)
Hemoglobin: 14.4 g/dL (ref 14.1–18.1)
LYMPH, POC: 4.5 — AB (ref 0.6–3.4)
MCH, POC: 24.8 pg — AB (ref 27–31.2)
MCHC: 32 g/dL (ref 31.8–35.4)
MCV: 77.5 fL — AB (ref 80–97)
MID (cbc): 0.7 (ref 0–0.9)
MPV: 7.2 fL (ref 0–99.8)
POC Granulocyte: 4.5 (ref 2–6.9)
POC LYMPH %: 46 % (ref 10–50)
POC MID %: 7.1 %M (ref 0–12)
Platelet Count, POC: 346 10*3/uL (ref 142–424)
RBC: 5.81 M/uL (ref 4.69–6.13)
RDW, POC: 18.7 %
WBC: 9.7 10*3/uL (ref 4.6–10.2)

## 2013-12-30 MED ORDER — TRIAMCINOLONE ACETONIDE 0.1 % EX CREA: 1.0000 "application " | TOPICAL_CREAM | Freq: Two times a day (BID) | CUTANEOUS | Status: AC

## 2013-12-30 NOTE — Patient Instructions (Signed)
Your blood count appears ok today. Continue antifungal cream to areas, other antifungal medicine as prescribed.  Ok to apply the new steroid cream to outer itching areas.  As other parts of rash are improving - no other changes for now.  Recheck with Dr. Neva SeatGreene on Wednesday the 11th after 3pm, or Dr. Katrinka BlazingSmith on Thursday the 12th after 4pm.  Return to the clinic or go to the nearest emergency room if any of your symptoms worsen or new symptoms occur.

## 2013-12-30 NOTE — Telephone Encounter (Signed)
Call patient.  Will change the medicine for the stomach.  RX: pantoprasole 40 #30 One daily RFx1

## 2013-12-30 NOTE — Progress Notes (Addendum)
Subjective:    Patient ID: Earl Levine, male    DOB: 06/16/1970, 43 y.o.   MRN: [id number]  HPI Chief Complaint  Patient presents with  . Rash    rash in groin area x2 weeks- not improving with meds.   This chart was scribed for Meredith StaggersJeffrey Orin Eberwein, MD by Andrew Auaven Small, ED Scribe. This patient was seen in room 14 and the patient's care was started at 8:44 AM.  HPI Comments: Earl Levine is a 43 y.o. male who presents to the [facility] complaining of a rash. Seen initial Oct 31 by Dr. Quintella ReichertHooper known to have a rash to groin as well as scrotum and penis. Pt was diagnosed with tinea cruris and was started on lamisil 200 mg QD for 2 weeks. Seen 1 day later for a  f/u by Dr. Katrinka BlazingSmith, rash then was diffusely involving genital region. Continue lamisil and added ketoconazole cream 3 times daily. HSV testing was preformed that showed antibodies to HSV but actual viral culture was negative. HIV RPR was non reactive. Here for f/u. Today pt presents with spreading itchy rash. Pt states rash had spread since last visit due to increase itchiness. Pt reports rash to inner thighs has shown improvement. Pt has been applying cream 3 times daily and taking lamisil once daily. Pt reports feeling feverish but denies taking his temperature. He denies dysuria and penile discharge. Pt denies h/o similar rash and herpes.   Patient Active Problem List   Diagnosis Date Noted  . Blood pressure elevated without history of HTN 12/22/2013  . Tinea cruris 12/22/2013  . Gastroesophageal reflux disease without esophagitis 12/22/2013  . Annual physical exam 06/05/2013  . GERD (gastroesophageal reflux disease) 06/05/2013   Past Medical History  Diagnosis Date  . GERD (gastroesophageal reflux disease)    Past Surgical History  Procedure Laterality Date  . No past surgeries     No Known Allergies Prior to Admission medications   Medication Sig Start Date End Date Taking? Authorizing Provider    ketoconazole (NIZORAL) 2 % cream Apply 1 application topically 3 (three) times daily. 12/23/13  Yes Ethelda ChickKristi M Smith, MD  terbinafine (LAMISIL) 250 MG tablet Take 1 tablet (250 mg total) by mouth daily. 12/22/13  Yes Peyton Najjaravid H Hopper, MD   History   Social History  . Marital Status: Divorced    Spouse Name: N/A    Number of Children: 0  . Years of Education: N/A   Occupational History  . works in a factory    Social History Main Topics  . Smoking status: Current Every Day Smoker -- 1.00 packs/day    Types: Cigarettes  . Smokeless tobacco: Never Used     Comment: 1/2 ppd  . Alcohol Use: No  . Drug Use: No  . Sexual Activity: Not on file   Other Topics Concern  . Not on file   Social History Narrative   Lives by himself   [place]. [place]oved [place] the [place] 1999    Review of Systems  Constitutional: Positive for fever.  Genitourinary: Negative for dysuria, discharge and difficulty urinating.  Skin: Positive for rash.    Objective:   Physical Exam  Constitutional: He is oriented to person, place, and time. He appears well-developed and well-nourished. No distress.  HENT:  Head: Normocephalic and atraumatic.  Eyes: Conjunctivae and EOM are normal.  Neck: Neck supple.  Cardiovascular: Normal rate.   Pulmonary/Chest: Effort normal.  Genitourinary:  Thickened type scaling patching bilaterally  with surrounding macular papular lesions. Just outside patching as well as penis and scrotum with multiple macular ulcer lesion that has been improving with his history. No penile discharge but there is a wet appearance to ulcers.  Musculoskeletal: Normal range of motion.  Neurological: He is alert and oriented to person, place, and time.  Skin: Skin is warm and dry.  Psychiatric: He has a normal mood and affect. His behavior is normal.  Nursing note and vitals reviewed.  Filed Vitals:   12/30/13 0829  BP: 132/90  Pulse: 83  Temp: 97.5 F (36.4 C)  TempSrc: Oral  Resp: 18  Height:  5' 10.75" (1.797 m)  Weight: 230 lb 6.4 oz (104.509 kg)  SpO2: 100%   Results for orders placed or performed in visit on 12/30/13  POCT CBC  Result Value Ref Range   WBC 9.7 4.6 - 10.2 K/uL   Lymph, poc 4.5 (A) 0.6 - 3.4   POC LYMPH PERCENT 46.0 10 - 50 %L   MID (cbc) 0.7 0 - 0.9   POC MID % 7.1 0 - 12 %M   POC Granulocyte 4.5 2 - 6.9   Granulocyte percent 46.9 37 - 80 %G   RBC 5.81 4.69 - 6.13 M/uL   Hemoglobin 14.4 14.1 - 18.1 g/dL   HCT, POC 66.445.1 40.343.5 - 53.7 %   MCV 77.5 (A) 80 - 97 fL   MCH, POC 24.8 (A) 27 - 31.2 pg   MCHC 32.0 31.8 - 35.4 g/dL   RDW, POC 47.418.7 %   Platelet Count, POC 346 142 - 424 K/uL   MPV 7.2 0 - 99.8 fL      Assessment & Plan:   Earl Levine is a 43 y.o. male Penile rash - Plan: POCT CBC, triamcinolone cream (KENALOG) 0.1 %  Groin rash - Plan: POCT CBC  Fever and chills - Plan: POCT CBC  Pruritus - Plan: triamcinolone cream (KENALOG) 0.1 %  Groin rash with spread to penis. Afebrile in office and reassuring CBC.  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 Penile rash and proximal groin rash reportedly improving per patient.  Peripheral rash/pruriitus still may be fungal and with pruritic satellite lesions, candida possible. Already on Lamisil po and topical ketoconazole.  Rx for triamcinolone topical given, and plan on recheck in 3-4 days with myself or Dr. Katrinka BlazingSmith. Sooner or ER if worse. Understanding expressed.   Meds ordered this encounter  Medications  . triamcinolone cream (KENALOG) 0.1 %    Sig: Apply 1 application topically 2 (two) times daily. To itching areas only.    Dispense:  30 g    Refill:  0   Patient Instructions  Your blood count appears ok today. Continue antifungal cream to areas, other antifungal medicine as prescribed.  Ok to apply the new steroid cream to outer itching areas.  As other parts of rash are improving - no other changes for now.   Recheck with Dr. Neva SeatGreene on Wednesday the 11th after 3pm, or Dr. Katrinka BlazingSmith on Thursday the 12th after 4pm.  Return to the clinic or go to the nearest emergency room if any of your symptoms worsen or new symptoms occur.     I personally performed the services described in this documentation, which was scribed in my presence. The recorded information has been reviewed and considered, and addended by me as needed.

## 2014-01-01 NOTE — Telephone Encounter (Signed)
Pt advised.

## 2014-01-15 ENCOUNTER — Telehealth: Payer: Self-pay

## 2014-01-15 NOTE — Telephone Encounter (Signed)
Pt called and wants to speak with Huntley DecSara. Please return call and advise. CB # [phone]

## 2014-01-15 NOTE — Telephone Encounter (Signed)
Clld pt - asked which Huntley DecSara did he need to speak to? Pt advsd he received a call on yesterday. Advsd there was not any notations. Pt stated ok disregard and hung up.

## 2014-01-16 ENCOUNTER — Encounter (HOSPITAL_COMMUNITY): Payer: Self-pay

## 2014-01-16 ENCOUNTER — Emergency Department (HOSPITAL_COMMUNITY)
Admission: EM | Admit: 2014-01-16 | Discharge: 2014-01-16 | Disposition: A | Discharge status: Home / Self Care | Payer: 59 | Source: Home / Self Care | Attending: Emergency Medicine | Admitting: Emergency Medicine

## 2014-01-16 ENCOUNTER — Ambulatory Visit (HOSPITAL_COMMUNITY): Payer: 59 | Attending: Emergency Medicine

## 2014-01-16 DIAGNOSIS — R21 Rash and other nonspecific skin eruption: Secondary | ICD-10-CM

## 2014-01-16 DIAGNOSIS — R05 Cough: Secondary | ICD-10-CM | POA: Insufficient documentation

## 2014-01-16 DIAGNOSIS — B019 Varicella without complication: Secondary | ICD-10-CM | POA: Diagnosis not present

## 2014-01-16 MED ORDER — CLINDAMYCIN PHOSPHATE 1 % EX GEL: Freq: Two times a day (BID) | CUTANEOUS | Status: DC

## 2014-01-16 MED ORDER — ALUM SULFATE-CA ACETATE EX PACK: 1.0000 | PACK | Freq: Three times a day (TID) | CUTANEOUS | Status: DC

## 2014-01-16 MED ORDER — VALACYCLOVIR HCL 1 G PO TABS: 1000.0000 mg | ORAL_TABLET | Freq: Three times a day (TID) | ORAL | Status: AC

## 2014-01-16 MED ORDER — FLUCONAZOLE 100 MG PO TABS: 100.0000 mg | ORAL_TABLET | Freq: Every day | ORAL | Status: DC

## 2014-01-16 MED ORDER — VALACYCLOVIR HCL 1 G PO TABS: 1000.0000 mg | ORAL_TABLET | Freq: Three times a day (TID) | ORAL | Status: DC

## 2014-01-16 NOTE — Discharge Instructions (Signed)
Cutaneous Candidiasis Cutaneous candidiasis is a condition in which there is an overgrowth of yeast (candida) on the skin. Yeast normally live on the skin, but in small enough numbers not to cause any symptoms. In certain cases, increased growth of the yeast may cause an actual yeast infection. This kind of infection usually occurs in areas of the skin that are constantly warm and moist, such as the armpits or the groin. Yeast is the most common cause of diaper rash in babies and in people who cannot control their bowel movements (incontinence). CAUSES  The fungus that most often causes cutaneous candidiasis is Candida albicans. Conditions that can increase the risk of getting a yeast infection of the skin include:  Obesity.  Pregnancy.  Diabetes.  Taking antibiotic medicine.  Taking birth control pills.  Taking steroid medicines.  Thyroid disease.  An iron or zinc deficiency.  Problems with the immune system. SYMPTOMS   Red, swollen area of the skin.  Bumps on the skin.  Itchiness. DIAGNOSIS  The diagnosis of cutaneous candidiasis is usually based on its appearance. Light scrapings of the skin may also be taken and viewed under a microscope to identify the presence of yeast. TREATMENT  Antifungal creams may be applied to the infected skin. In severe cases, oral medicines may be needed.  HOME CARE INSTRUCTIONS   Keep your skin clean and dry.  Maintain a healthy weight.  If you have diabetes, keep your blood sugar under control. SEEK IMMEDIATE MEDICAL CARE IF:  Your rash continues to spread despite treatment.  You have a fever, chills, or abdominal pain. Document Released: 10/27/2010 Document Revised: 05/03/2011 Document Reviewed: 10/27/2010 [facility] Patient Information 2015 [place], [place]. This information is not intended to replace advice given to you by your health care provider. Make sure you discuss any questions you have with your health care  provider.  Chickenpox Chickenpox is an illness caused by a virus. Chickenpox can be very serious for adults. Adults have a higher risk for complications than children. Complications of chickenpox include:  Pneumonia.  Skin infection.  Bone infection (osteomyelitis).  Joint infection (septic arthritis).  Brain infection (encephalitis).  Toxic shock syndrome.  Bleeding problems.  Problems with balance and muscle control (cerebellar ataxia).  Having a baby with a birth defect, if you are pregnant.  Death. If you have had chickenpox once, you probably will not get it again. If you are exposed to chickenpox and have never had the illness or the vaccine, you may develop the illness within 21 days. CAUSES  Chickenpox is caused by a virus. This virus spreads easily from one person to another through the tiny droplets released into the air when an infected person coughs or sneezes. It also spreads through the fluid produced by the chickenpox rash. RISK FACTORS People who have never had chickenpox and have never been vaccinated are at risk. You may also be at greater risk for chickenpox if:  You are a health care worker.  You are a Archivistcollege student.  You are in the [employer] and [employer].  You live in an institution.  You are a Runner, broadcasting/film/videoteacher.  You have poor resistance to infection. SIGNS AND SYMPTOMS   Rash. The rash is made up of itchy blisters that heal over with a crusty scab. The rash may appear a day or two after other symptoms.  Body aches and pain.  Headache.  Irritability.  Tiredness.  Fever.  Sore throat. DIAGNOSIS  Your health care provider will make a diagnosis based on your  symptoms. You may have a blood test to confirm the diagnosis. TREATMENT  Treatments may include:  Taking medicine to shorten how long the illness lasts and to reduce its severity.  Applying calamine lotion to relieve itchiness.  Using baking soda or oatmeal baths to soothe itchy skin. Ask your health  care provider if you may use a type of over-the-counter medicine called an antihistamine to reduce itching. HOME CARE INSTRUCTIONS   Take medicines only as directed by your health care provider.  Try taking a bath in lukewarm water every few hours. Add several tablespoons of baking soda or oatmeal to the water to make the bath more soothing. Do not bathe in hot water.  Apply an ice pack or a cold washcloth to the rash to relieve itching.  Wash your hands often. This lowers your chance of getting a bacterial skin infection and passing the virus to others.  Do not eat or drink spicy, salty, or acidic things if you have blisters in your mouth. Soft, bland, and cold foods and beverages will be easiest to swallow.  Do not be around:  People who have not had chickenpox and have not been vaccinated against it.  People with a weakened immune system, including those with HIV, AIDS, or cancer, those who have had a transplant or are on chemotherapy, and those who take medicines that weaken the immune system.  Pregnant women.  If a person is exposed to your chickenpox and has not had it before or been vaccinated against it, has a weakened immune system, or is pregnant, he or she should call a health care provider. SEEK IMMEDIATE MEDICAL CARE IF:   You have trouble breathing.  You have a severe headache.  You have a stiff neck.  You have severe joint pain or stiffness.  You feel disoriented or confused.  You have trouble walking or keeping your balance.  You have a fever and your symptoms suddenly get worse.  The area around one of the blisters leaks pus or becomes very red, hot to the touch, or painful. MAKE SURE YOU:  Understand these instructions.  Will watch your condition.  Will get help right away if you are not doing well or get worse. Document Released: 11/18/2007 Document Revised: 06/25/2013 Document Reviewed: 01/02/2013 [facility] Patient Information 2015 [place], [place].  This information is not intended to replace advice given to you by your health care provider. Make sure you discuss any questions you have with your health care provider.   Rash A rash is a change in the color or texture of your skin. There are many different types of rashes. You may have other problems that accompany your rash. CAUSES   Infections.  Allergic reactions. This can include allergies to pets or foods.  Certain medicines.  Exposure to certain chemicals, soaps, or cosmetics.  Heat.  Exposure to poisonous plants.  Tumors, both cancerous and noncancerous. SYMPTOMS   Redness.  Scaly skin.  Itchy skin.  Dry or cracked skin.  Bumps.  Blisters.  Pain. DIAGNOSIS  Your caregiver may do a physical exam to determine what type of rash you have. A skin sample (biopsy) may be taken and examined under a microscope. TREATMENT  Treatment depends on the type of rash you have. Your caregiver may prescribe certain medicines. For serious conditions, you may need to see a skin doctor (dermatologist). HOME CARE INSTRUCTIONS   Avoid the substance that caused your rash.  Do not scratch your rash. This can cause infection.  You may take cool baths to help stop itching.  Only take over-the-counter or prescription medicines as directed by your caregiver.  Keep all follow-up appointments as directed by your caregiver. SEEK IMMEDIATE MEDICAL CARE IF:  You have increasing pain, swelling, or redness.  You have a fever.  You have new or severe symptoms.  You have body aches, diarrhea, or vomiting.  Your rash is not better after 3 days. MAKE SURE YOU:  Understand these instructions.  Will watch your condition.  Will get help right away if you are not doing well or get worse. Document Released: 01/29/2002 Document Revised: 05/03/2011 Document Reviewed: 11/23/2010 [facility] Patient Information 2015 [place], [place]. This information is not intended to replace advice given  to you by your health care provider. Make sure you discuss any questions you have with your health care provider.

## 2014-01-16 NOTE — ED Provider Notes (Addendum)
CSN: [id number]     Arrival date & time 01/16/14  [id number] History   First MD Initiated Contact with Patient 01/16/14 0915     Chief Complaint  Patient presents with  . Rash   (Consider location/radiation/quality/duration/timing/severity/associated sxs/prior Treatment) HPI         43 year old male presents complaining of a rash on his groin for about 6 weeks this started on his lower abdomen spread down to his groin. Also has a rash on his legs and face, and cold symptoms of congestion, sneezing, and cough. The rash on his groin is itching and minimally painful. He has been seen multiple times by his primary care provider and has been treated with oral terbinafine, ketoconazole cream, and triamcinolone cream. The rash has gotten slightly better but is still very bothersome. It does in his groin, testicles and penis. No abdominal pain, testicle pain, groin pain, or fever. The rash on his face showed up a few days ago and was followed soon thereafter by a rash on his thighs. This rash is very itchy as well. No recent sick contacts or recent travel. He has never had chickenpox and never got the varicella vaccine.   Past Medical History  Diagnosis Date  . GERD (gastroesophageal reflux disease)    Past Surgical History  Procedure Laterality Date  . No past surgeries     Family History  Problem Relation Age of Onset  . Colon cancer Neg Hx   . Prostate cancer Neg Hx   . CAD Neg Hx   . Diabetes Neg Hx   . Hypertension Neg Hx    History  Substance Use Topics  . Smoking status: Current Every Day Smoker -- 1.00 packs/day    Types: Cigarettes  . Smokeless tobacco: Never Used     Comment: 1/2 ppd  . Alcohol Use: No    Review of Systems  Constitutional: Negative for fever and chills.  HENT: Positive for congestion and sneezing. Negative for ear pain, sinus pressure and sore throat.   Respiratory: Positive for cough. Negative for shortness of breath.   Cardiovascular: Negative for chest pain.   Skin: Positive for rash (see history of present illness).  All other systems reviewed and are negative.   Allergies  Review of patient's allergies indicates no known allergies.  Home Medications   Prior to Admission medications   Medication Sig Start Date End Date Taking? Authorizing Provider  aluminum sulfate-calcium acetate (DOMEBORO) packet Apply 1 packet topically 3 (three) times daily. 01/16/14   Adrian BlackwaterZachary H Isaly Fasching, PA-C  clindamycin (CLINDAGEL) 1 % gel Apply topically 2 (two) times daily. 01/16/14   Graylon GoodZachary H Lorisa Scheid, PA-C  fluconazole (DIFLUCAN) 100 MG tablet Take 1 tablet (100 mg total) by mouth daily. 01/16/14   Adrian BlackwaterZachary H Terena Bohan, PA-C  ketoconazole (NIZORAL) 2 % cream Apply 1 application topically 3 (three) times daily. 12/23/13   Ethelda ChickKristi M Smith, MD  terbinafine (LAMISIL) 250 MG tablet Take 1 tablet (250 mg total) by mouth daily. 12/22/13   Peyton Najjaravid H Hopper, MD  triamcinolone cream (KENALOG) 0.1 % Apply 1 application topically 2 (two) times daily. To itching areas only. 12/30/13   Shade FloodJeffrey R Greene, MD  valACYclovir (VALTREX) 1000 MG tablet Take 1 tablet (1,000 mg total) by mouth 3 (three) times daily. 01/16/14 01/30/14  Adrian BlackwaterZachary H Sammie Denner, PA-C   BP 168/108 mmHg  Pulse 87  Temp(Src) 98.9 F (37.2 C) (Oral)  Resp 16  SpO2 97% Physical Exam  Constitutional: He is oriented to person, place,  and time. He appears well-developed and well-nourished. No distress.  HENT:  Head: Normocephalic.  Cardiovascular: Normal rate, regular rhythm, normal heart sounds and intact distal pulses.   Pulmonary/Chest: Effort normal and breath sounds normal. No respiratory distress.  Neurological: He is alert and oriented to person, place, and time. Coordination normal.  Skin: Skin is warm and dry. Rash (Erythematous rash in the groin, testicles, and penis, with a slight scale, slightly weeping, with no subcutaneous emphysema or crepitus, nontender) noted. Rash is vesicular (Discrete vesicles on erythematous base  on bilateral 5, with an erythematous papular rash on the face). He is not diaphoretic.  Psychiatric: He has a normal mood and affect. Judgment normal.  Nursing note and vitals reviewed.   ED Course  Procedures (including critical care time) Labs Review Labs Reviewed  WOUND CULTURE    Imaging Review Dg Chest 2 View  01/16/2014   CLINICAL DATA:  Cough.  Chickenpox.  Initial encounter.  EXAM: CHEST  2 VIEW  COMPARISON:  None.  FINDINGS: The heart size and mediastinal contours are within normal limits. Both lungs are clear. The visualized skeletal structures are unremarkable.  IMPRESSION: No active cardiopulmonary disease.   Electronically Signed   By: Andreas NewportGeoffrey  Lamke M.D.   On: 01/16/2014 10:10     MDM   1. Rash of groin   2. Chickenpox    He has been treated for tinea cruris and contact dermatitis, this has not helped. Treat with Diflucan and clindamycin gel to cover Candida as well as erythrasma, follow-up with dermatology if not improving. Also this appears to be chickenpox. His chest x-ray is normal, no signs of pneumonia. With Valtrex. Out of work for now. Follow-up if worsening for recheck   Meds ordered this encounter  Medications  . DISCONTD: clindamycin (CLINDAGEL) 1 % gel    Sig: Apply topically 2 (two) times daily.    Dispense:  30 g    Refill:  1    For 2 weeks    Order Specific Question:  Supervising Provider    Answer:  Linna HoffKINDL, JAMES D [phone][[id number]]  . DISCONTD: aluminum sulfate-calcium acetate (DOMEBORO) packet    Sig: Apply 1 packet topically 3 (three) times daily.    Dispense:  14 each    Refill:  1    Order Specific Question:  Supervising Provider    Answer:  Linna HoffKINDL, JAMES D [phone][[id number]]  . DISCONTD: valACYclovir (VALTREX) 1000 MG tablet    Sig: Take 1 tablet (1,000 mg total) by mouth 3 (three) times daily.    Dispense:  21 tablet    Refill:  0    Order Specific Question:  Supervising Provider    Answer:  Linna HoffKINDL, JAMES D [phone][[id number]]  . DISCONTD: fluconazole (DIFLUCAN) 100 MG  tablet    Sig: Take 1 tablet (100 mg total) by mouth daily.    Dispense:  14 tablet    Refill:  0    Order Specific Question:  Supervising Provider    Answer:  Linna HoffKINDL, JAMES D [phone][[id number]]  . aluminum sulfate-calcium acetate (DOMEBORO) packet    Sig: Apply 1 packet topically 3 (three) times daily.    Dispense:  14 each    Refill:  1    Order Specific Question:  Supervising Provider    Answer:  Linna HoffKINDL, JAMES D [phone][[id number]]  . clindamycin (CLINDAGEL) 1 % gel    Sig: Apply topically 2 (two) times daily.    Dispense:  30 g    Refill:  1  For 2 weeks    Order Specific Question:  Supervising Provider    Answer:  Linna Hoff [[id number]]  . fluconazole (DIFLUCAN) 100 MG tablet    Sig: Take 1 tablet (100 mg total) by mouth daily.    Dispense:  14 tablet    Refill:  0    Order Specific Question:  Supervising Provider    Answer:  Linna Hoff [phone]  . valACYclovir (VALTREX) 1000 MG tablet    Sig: Take 1 tablet (1,000 mg total) by mouth 3 (three) times daily.    Dispense:  21 tablet    Refill:  0    Order Specific Question:  Supervising Provider    Answer:  Bradd Canary D [[id number]]      Graylon Good, PA-C 01/16/14 1114   Culture came back showing staph aureus.  Called pt for clinical improvement, he is completely better, both rashes have completely resolved.  Advised him to continue to regimen for 2 weeks and to follow up as needed.    Graylon Good, PA-C 01/23/14 0800

## 2014-01-16 NOTE — ED Notes (Signed)
C/o rash in groin area x 1 month, and URI type symptoms

## 2014-01-19 LAB — WOUND CULTURE: Gram Stain: NONE SEEN

## 2014-01-22 NOTE — ED Notes (Signed)
Wound culture scrotum: Abundant Staph aureus.  Adequately treated with Clindagel. Vassie MoselleYork, Daisean Brodhead M 01/22/2014

## 2015-02-17 IMAGING — DX DG CHEST 2V
2 series · 2 of 2 positions shown · non-contrast
Comparison: None.

CLINICAL DATA: Cough.  Chickenpox.  Initial encounter.

EXAM:
CHEST  2 VIEW

[chest pa]
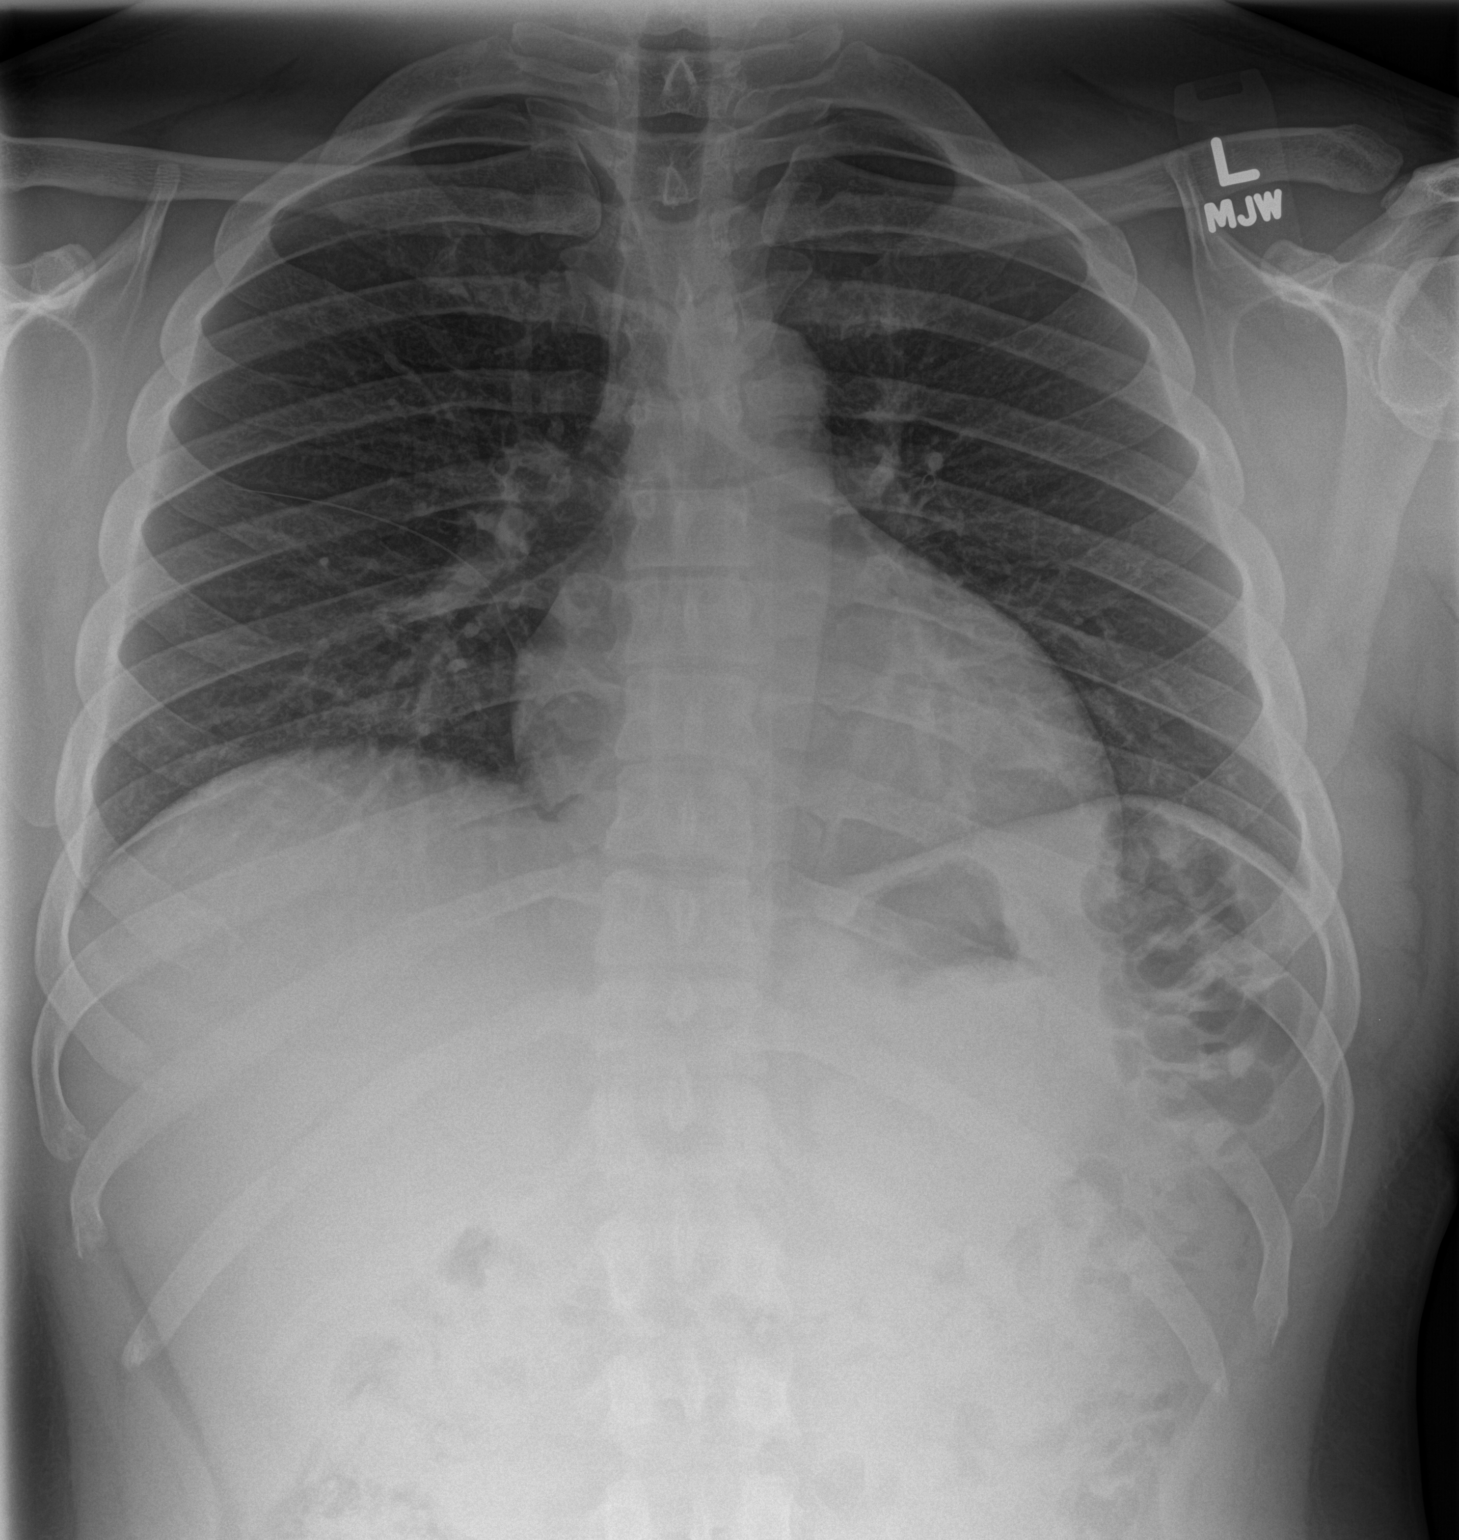

[chest lat]
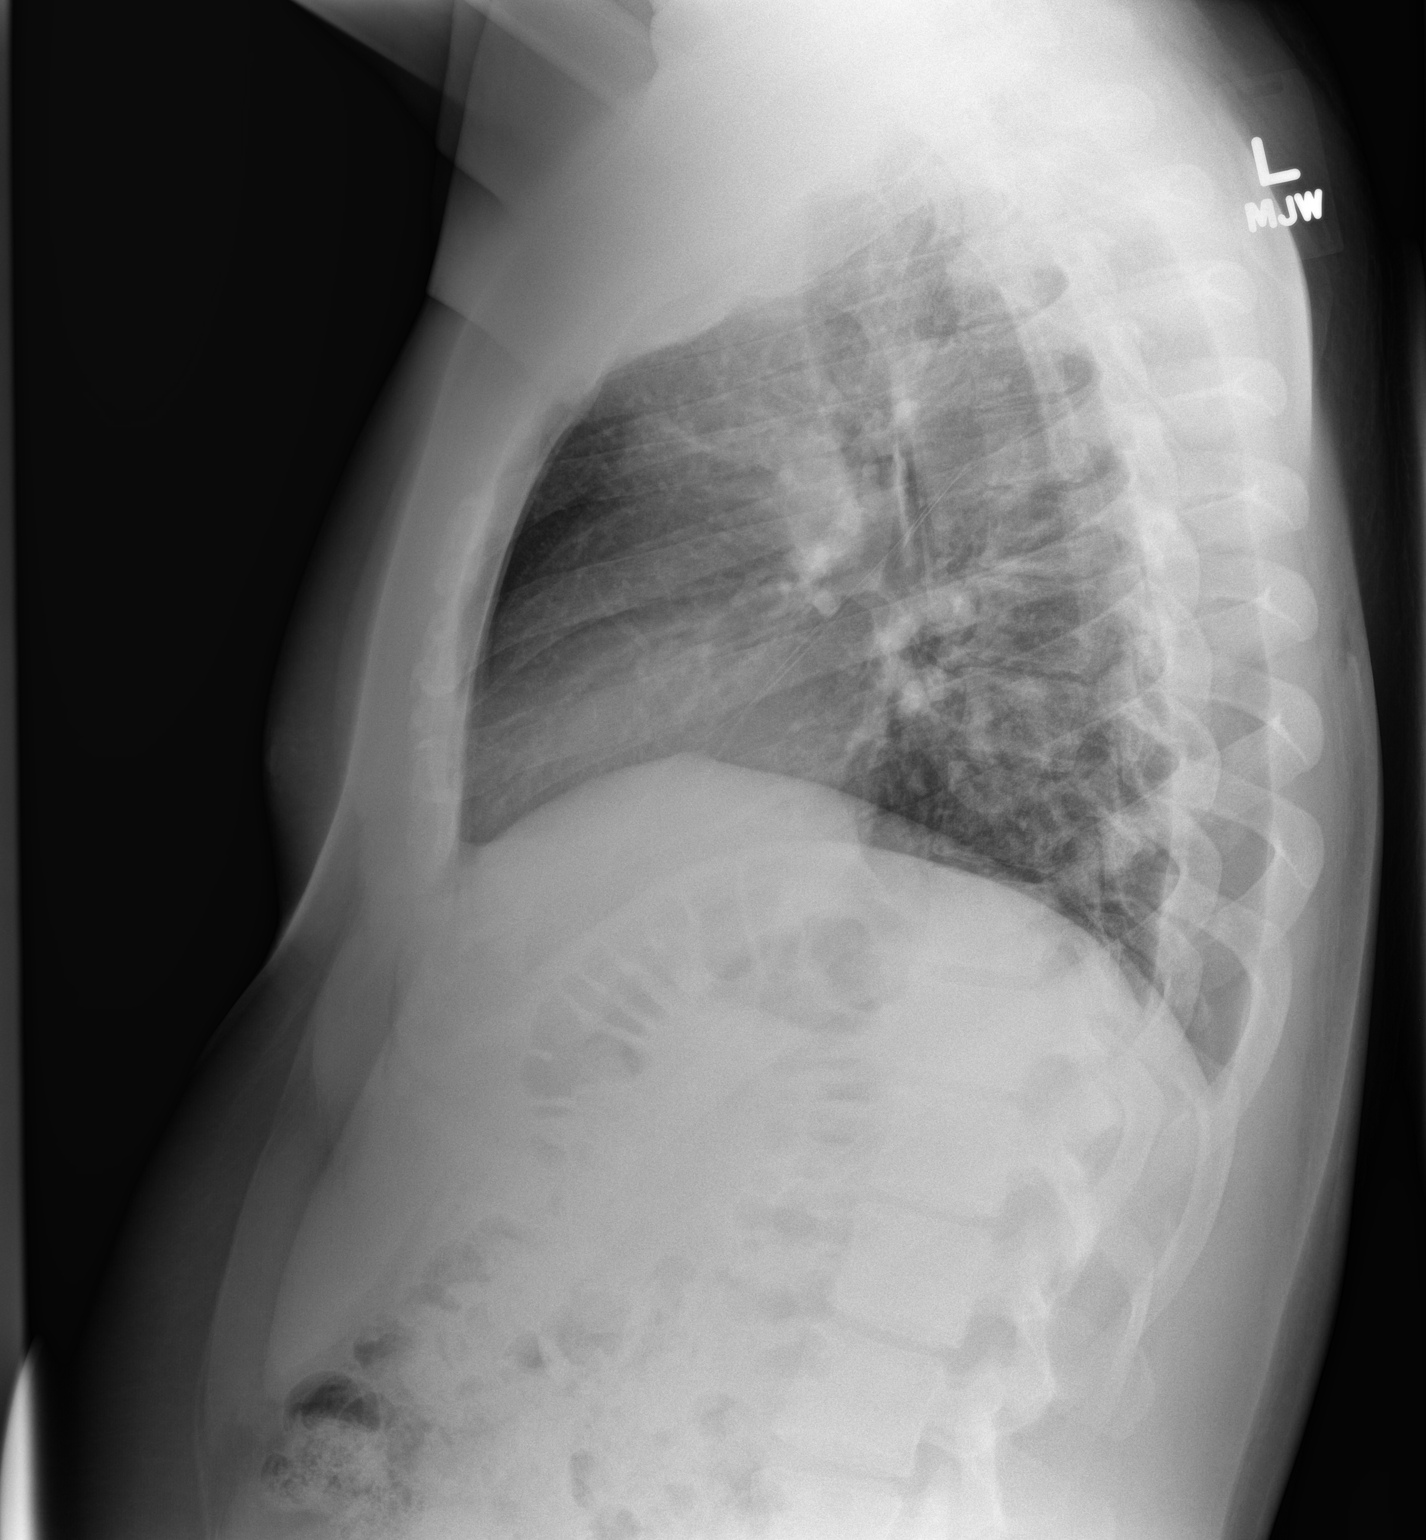

[2 of 2 positions shown; findings below may reference images not displayed]

FINDINGS: The heart size and mediastinal contours are within normal limits.
Both lungs are clear. The visualized skeletal structures are
unremarkable.
IMPRESSION: No active cardiopulmonary disease.

## 2015-12-30 ENCOUNTER — Emergency Department (HOSPITAL_COMMUNITY)
Admission: EM | Admit: 2015-12-30 | Discharge: 2015-12-31 | Disposition: A | Discharge status: Home / Self Care | Payer: 59 | Attending: Emergency Medicine | Admitting: Emergency Medicine

## 2015-12-30 ENCOUNTER — Encounter (HOSPITAL_COMMUNITY): Payer: Self-pay | Admitting: Emergency Medicine

## 2015-12-30 DIAGNOSIS — I10 Essential (primary) hypertension: Secondary | ICD-10-CM | POA: Insufficient documentation

## 2015-12-30 DIAGNOSIS — R002 Palpitations: Secondary | ICD-10-CM | POA: Insufficient documentation

## 2015-12-30 DIAGNOSIS — K219 Gastro-esophageal reflux disease without esophagitis: Secondary | ICD-10-CM | POA: Insufficient documentation

## 2015-12-30 DIAGNOSIS — F17210 Nicotine dependence, cigarettes, uncomplicated: Secondary | ICD-10-CM | POA: Diagnosis not present

## 2015-12-30 DIAGNOSIS — R12 Heartburn: Secondary | ICD-10-CM | POA: Diagnosis present

## 2015-12-30 LAB — BASIC METABOLIC PANEL
ANION GAP: 9 (ref 5–15)
BUN: 8 mg/dL (ref 6–20)
CALCIUM: 10.1 mg/dL (ref 8.9–10.3)
CHLORIDE: 101 mmol/L (ref 101–111)
CO2: 28 mmol/L (ref 22–32)
CREATININE: 1.04 mg/dL (ref 0.61–1.24)
GFR calc non Af Amer: >60 mL/min (ref >60)
Glucose, Bld: 179 mg/dL — ABNORMAL HIGH (ref 65–99)
Potassium: 3.6 mmol/L (ref 3.5–5.1)
Sodium: 138 mmol/L (ref 135–145)

## 2015-12-30 LAB — CBC
HCT: 47.1 % (ref 39.0–52.0)
Hemoglobin: 15.9 g/dL (ref 13.0–17.0)
MCH: 25.3 pg — AB (ref 26.0–34.0)
MCHC: 33.8 g/dL (ref 30.0–36.0)
MCV: 75 fL — ABNORMAL LOW (ref 78.0–100.0)
PLATELETS: 314 10*3/uL (ref 150–400)
RBC: 6.28 MIL/uL — ABNORMAL HIGH (ref 4.22–5.81)
RDW: 14.5 % (ref 11.5–15.5)
WBC: 10.3 10*3/uL (ref 4.0–10.5)

## 2015-12-30 LAB — I-STAT TROPONIN, ED: TROPONIN I, POC: 0 ng/mL (ref 0.00–0.08)

## 2015-12-30 MED ORDER — GI COCKTAIL ~~LOC~~
30.0000 mL | Freq: Once | ORAL | Status: AC
  Administered 2015-12-31: 30 mL via ORAL
  Filled 2015-12-30: qty 30

## 2015-12-30 MED ORDER — PANTOPRAZOLE SODIUM 40 MG PO TBEC
40.0000 mg | DELAYED_RELEASE_TABLET | Freq: Once | ORAL | Status: DC
  Filled 2015-12-30: qty 1

## 2015-12-30 NOTE — ED Triage Notes (Signed)
Pt states "My blood pressure kept going up all day, i've had heartburn for a couple months, it got worse today."

## 2015-12-31 ENCOUNTER — Emergency Department (HOSPITAL_COMMUNITY): Payer: 59

## 2015-12-31 MED ORDER — SUCRALFATE 1 G PO TABS: 1.0000 g | ORAL_TABLET | Freq: Three times a day (TID) | ORAL | 0 refills | Status: DC

## 2015-12-31 NOTE — ED Notes (Signed)
Patient transported to x-ray. ?

## 2015-12-31 NOTE — ED Provider Notes (Signed)
[facility]-EMERGENCY DEPT Provider Note   CSN: [id number] Arrival date & time: 12/30/15  1935     History   Chief Complaint Chief Complaint  Patient presents with  . Heartburn  . Hypertension    HPI Earl Levine is a 45 y.o. male.  HPI   Patient is a 45 year old male who presents emergency Department with concern of elevated blood pressure and heart burn, both of which Earl Levine was evaluated earlier today for at his primary care provider office.  Earl Levine states Earl Levine came to the ER because "my blood pressure kept going up all day, and I've had heartburn for a couple months and it got worse today."  Patient's doctor adjusted to his blood pressure medications and added hydrochlorothiazide, Earl Levine was previously taking lisinopril and Norvasc.  Earl Levine denies chest pain, shortness of breath, lower extremity edema, orthopnea, PND, near syncope, syncope, headache or blurry vision.  Earl Levine has intermittent palpitations w/o any associated sx.  palpitations are brief, lasting a few seconds, Earl Levine has had roughly 2 episodes daily over the last 3 days.   Earl Levine complains of months of heartburn which is slightly worse today. Earl Levine has pain in his epigastrium and intermittently Earl Levine has reflux and burning radiating up to his neck.  More painful today than it has been in the past and Earl Levine rates his epigastric pain is 8 out of 10, described as burning and intermittent. Earl Levine was also given a new medication for this today to his doctor's office. Earl Levine took Protonix, 40 mg, earlier today, and has not had any other over-the-counter or prescription treatments prior to arrival.  Earl Levine denies any dysphasia, nausea, vomiting, abdominal distention.  Earl Levine does have a EGD scheduled.   No other associated symptoms.  Past Medical History:  Diagnosis Date  . GERD (gastroesophageal reflux disease)     Patient Active Problem List   Diagnosis Date Noted  . Blood pressure elevated without history of HTN 12/22/2013  . Tinea cruris 12/22/2013  . Gastroesophageal reflux  disease without esophagitis 12/22/2013  . Annual physical exam 06/05/2013  . GERD (gastroesophageal reflux disease) 06/05/2013    Past Surgical History:  Procedure Laterality Date  . NO PAST SURGERIES         Home Medications    Prior to Admission medications   Medication Sig Start Date End Date Taking? Authorizing Provider  amLODipine (NORVASC) 5 MG tablet Take 5 mg by mouth daily.   Yes Historical Provider, MD  hydrochlorothiazide (HYDRODIURIL) 25 MG tablet Take 25 mg by mouth daily.   Yes Historical Provider, MD  lisinopril (PRINIVIL,ZESTRIL) 20 MG tablet Take 20 mg by mouth daily.   Yes Historical Provider, MD  pantoprazole (PROTONIX) 40 MG tablet Take 40 mg by mouth daily.   Yes Historical Provider, MD  aluminum sulfate-calcium acetate (DOMEBORO) packet Apply 1 packet topically 3 (three) times daily. Patient not taking: Reported on 12/30/2015 01/16/14   Graylon Good, PA-C  clindamycin (CLINDAGEL) 1 % gel Apply topically 2 (two) times daily. Patient not taking: Reported on 12/30/2015 01/16/14   Graylon Good, PA-C  fluconazole (DIFLUCAN) 100 MG tablet Take 1 tablet (100 mg total) by mouth daily. Patient not taking: Reported on 12/30/2015 01/16/14   Graylon Good, PA-C  ketoconazole (NIZORAL) 2 % cream Apply 1 application topically 3 (three) times daily. Patient not taking: Reported on 12/30/2015 12/23/13   Ethelda Chick, MD  sucralfate (CARAFATE) 1 g tablet Take 1 tablet (1 g total) by mouth 4 (four) times daily -  with meals and at bedtime. 12/31/15   Danelle BerryLeisa Magaline Steinberg, PA-C  terbinafine (LAMISIL) 250 MG tablet Take 1 tablet (250 mg total) by mouth daily. Patient not taking: Reported on 12/30/2015 12/22/13   Peyton Najjaravid H Hopper, MD  triamcinolone cream (KENALOG) 0.1 % Apply 1 application topically 2 (two) times daily. To itching areas only. Patient not taking: Reported on 12/30/2015 12/30/13   Shade FloodJeffrey R Greene, MD    Family History Family History  Problem Relation Age of Onset  .  Colon cancer Neg Hx   . Prostate cancer Neg Hx   . CAD Neg Hx   . Diabetes Neg Hx   . Hypertension Neg Hx     Social History Social History  Substance Use Topics  . Smoking status: Current Every Day Smoker    Packs/day: 1.00    Types: Cigarettes  . Smokeless tobacco: Never Used     Comment: 1/2 ppd  . Alcohol use No     Allergies   Patient has no known allergies.   Review of Systems Review of Systems  All other systems reviewed and are negative.    Physical Exam Updated Vital Signs BP 138/96   Pulse 80   Temp 98.4 F (36.9 C) (Oral)   Resp 18   SpO2 99%   Physical Exam  Constitutional: Earl Levine is oriented to person, place, and time. Earl Levine appears well-developed and well-nourished. No distress.  HENT:  Head: Normocephalic and atraumatic.  Right Ear: External ear normal.  Left Ear: External ear normal.  Nose: Nose normal.  Mouth/Throat: Oropharynx is clear and moist. No oropharyngeal exudate.  Eyes: Conjunctivae and EOM are normal. Pupils are equal, round, and reactive to light. Right eye exhibits no discharge. Left eye exhibits no discharge. No scleral icterus.  Neck: Normal range of motion. Neck supple. No JVD present. No tracheal deviation present.  Cardiovascular: Normal rate, regular rhythm, normal heart sounds and intact distal pulses.  Exam reveals no gallop and no friction rub.   No murmur heard. 2+ radial, DP, and PT pulses bilaterally No LE edema No JVD  Pulmonary/Chest: Effort normal and breath sounds normal. No stridor. No respiratory distress. Earl Levine has no wheezes. Earl Levine has no rales. Earl Levine exhibits no tenderness.  Abdominal: Soft. Bowel sounds are normal. Earl Levine exhibits no distension and no mass. There is tenderness. There is no rebound and no guarding.  Musculoskeletal: Normal range of motion. Earl Levine exhibits no edema.  Lymphadenopathy:    Earl Levine has no cervical adenopathy.  Neurological: Earl Levine is alert and oriented to person, place, and time. Earl Levine exhibits normal muscle tone.  Coordination normal.  Skin: Skin is warm and dry. Capillary refill takes less than 2 seconds. No rash noted. Earl Levine is not diaphoretic. No erythema. No pallor.  Psychiatric: Earl Levine has a normal mood and affect. His behavior is normal. Judgment and thought content normal.  Nursing note and vitals reviewed.    ED Treatments / Results  Labs (all labs ordered are listed, but only abnormal results are displayed) Labs Reviewed  BASIC METABOLIC PANEL - Abnormal; Notable for the following:       Result Value   Glucose, Bld 179 (*)    All other components within normal limits  CBC - Abnormal; Notable for the following:    RBC 6.28 (*)    MCV 75.0 (*)    MCH 25.3 (*)    All other components within normal limits  Rose[facility] SensorI-STAT TROPOININ, ED    EKG  EKG Interpretation  Date/Time:  Tuesday  December 30 2015 19:44:16 EST Ventricular Rate:  98 PR Interval:  154 QRS Duration: 92 QT Interval:  352 QTC Calculation: 449 R Axis:   62 Text Interpretation:  Normal sinus rhythm Right atrial enlargement Borderline ECG No previous ECGs available Confirmed by Bebe Shaggy  MD, DONALD ([id number]) on 12/30/2015 11:08:35 PM       Radiology Dg Abdomen Acute W/chest  Result Date: 12/31/2015 CLINICAL DATA:  Mid upper abdominal pain and mid lower chest pain. High blood pressure tonight. EXAM: DG ABDOMEN ACUTE W/ 1V CHEST COMPARISON:  None. FINDINGS: Normal heart size and pulmonary vascularity. No focal airspace disease or consolidation in the lungs. No blunting of costophrenic angles. No pneumothorax. Mediastinal contours appear intact. Scattered gas and stool throughout the colon with gas in a few small bowel loops. No small or large bowel distention is identified. No free intra-abdominal air. No abnormal air-fluid levels. No radiopaque stones. Visualized bones appear intact. IMPRESSION: No evidence of active pulmonary disease. Nonobstructive bowel gas pattern with scattered stool throughout the colon. Electronically Signed   By:  Burman Nieves M.D.   On: 12/31/2015 00:31    Procedures Procedures (including critical care time)  Medications Ordered in ED Medications  gi cocktail (Maalox,Lidocaine,Donnatal) (30 mLs Oral Given 12/31/15 0014)     Initial Impression / Assessment and Plan / ED Course  I have reviewed the triage vital signs and the nursing notes.  Pertinent labs & imaging results that were available during my care of the patient were reviewed by me and considered in my medical decision making (see chart for details).  Clinical Course    Pt presents with concern of HTN, which was already evaluated today by PCP with medications adjusted.  No CP, SOB, near syncope, HA, LE edema.  No evidence of end organ damage.  Cardiac workup in the ER is unremarkable.  Including nonischemic EKG, negative chest x-ray, negative troponin, and normal cardio vascular and pulmonary exams.  Patient also complains of acid reflux which was also worked up today in PCP office. Earl Levine was given a prescription of Protonix and has taken one dose.  Earl Levine complains of epigastric abdominal pain, Earl Levine mildly tender to palpation without rebound or guarding.  Pain improved in the year with GI cocktail.  Plain films of chest and abdomen unremarkable.  Reviewed conservative and supportive treatments for GERD, reviewed how to take Protonix.  Given Carafate to take in addition to protonix.  Earl Levine has GI outpt follow up already arranged, no concern for acute abdomen, tolerating PO's, no dysphagia.  Pt encouraged to follow up outpt with PCP and GI.  Appears stable to d/c home, suspect some of his elevated BP is secondary to pain and anxiety. Will recheck BP with PCP in 1-2 weeks.  Given cardiology information for palpitation follow up, however I did explain to him that his regular doctor may initiate this workup if necessary.  Cardiac monitoring was reviewed without any arrhythmias.  Return precautions reviewed, including return to ER immediately if pt has CP  that is exertional, associated with diaphoresis or nausea, radiates to left jaw/arm, worsens or becomes concerning in any way. Pt appears reliable for follow up and is agreeable to discharge.   Vitals:   12/31/15 0115 12/31/15 0130 12/31/15 0145 12/31/15 0200  BP: 140/99 169/99 139/97 138/96  Pulse: 61 66 64 80  Resp:    18  Temp:      TempSrc:      SpO2: 100% 100% 100% 99%  Final Clinical Impressions(s) / ED Diagnoses   Final diagnoses:  Hypertension, unspecified type  Gastroesophageal reflux disease, esophagitis presence not specified  Palpitation    New Prescriptions Discharge Medication List as of 12/31/2015 12:44 AM    START taking these medications   Details  sucralfate (CARAFATE) 1 g tablet Take 1 tablet (1 g total) by mouth 4 (four) times daily -  with meals and at bedtime., Starting Wed 12/31/2015, Print         Danelle BerryLeisa Rafeef Lau, PA-C 12/31/15 [phone]    Zadie Rhineonald Wickline, MD 12/31/15 [phone]

## 2016-01-04 ENCOUNTER — Encounter (HOSPITAL_COMMUNITY): Payer: Self-pay | Admitting: Emergency Medicine

## 2016-01-04 ENCOUNTER — Ambulatory Visit (HOSPITAL_COMMUNITY)
Admission: EM | Admit: 2016-01-04 | Discharge: 2016-01-04 | Disposition: A | Discharge status: Home / Self Care | Payer: 59 | Attending: Family Medicine | Admitting: Family Medicine

## 2016-01-04 DIAGNOSIS — K219 Gastro-esophageal reflux disease without esophagitis: Secondary | ICD-10-CM | POA: Diagnosis not present

## 2016-01-04 MED ORDER — GI COCKTAIL ~~LOC~~
30.0000 mL | Freq: Once | ORAL | Status: AC
  Administered 2016-01-04: 30 mL via ORAL

## 2016-01-04 MED ORDER — GI COCKTAIL ~~LOC~~
ORAL | Status: AC
  Filled 2016-01-04: qty 30

## 2016-01-04 NOTE — Discharge Instructions (Signed)
I am sorry you are having ongoing symptoms. You are seeing the right specialist for this. Suggest continuing with your medications as directed. F/U with Dr. Elnoria HowardHung as recommended. The GI cocktail will help you temporarily until you can get more answers. Feel better.

## 2016-01-04 NOTE — ED Provider Notes (Signed)
CSN: [id number]     Arrival date & time 01/04/16  1608 History   First MD Initiated Contact with Patient 01/04/16 1650     Chief Complaint  Patient presents with  . Abdominal Pain   (Consider location/radiation/quality/duration/timing/severity/associated sxs/prior Treatment) 45 yo black male with a recent diagnosis of GERD presents with ongoing acid reflux. He was seen in the ED with through work up. He was also evaluated by Dr. Elnoria HowardHung and is scheduled to have an EGD. He is taking Protonix and Carafate but states that it does not help. He is concerned he may have "stomach infection". He denies fevers, abdominal pain, N, V. He notes no real change since visit with Dr. Elnoria HowardHung. Past use of Zantac and Pepcid without relief.       Past Medical History:  Diagnosis Date  . GERD (gastroesophageal reflux disease)    Past Surgical History:  Procedure Laterality Date  . NO PAST SURGERIES     Family History  Problem Relation Age of Onset  . Colon cancer Neg Hx   . Prostate cancer Neg Hx   . CAD Neg Hx   . Diabetes Neg Hx   . Hypertension Neg Hx    Social History  Substance Use Topics  . Smoking status: Current Every Day Smoker    Packs/day: 1.00    Types: Cigarettes  . Smokeless tobacco: Current User     Comment: 1/2 ppd  . Alcohol use No    Review of Systems  Constitutional: Negative for fever.  Gastrointestinal: Negative for abdominal distention, abdominal pain, diarrhea, nausea and rectal pain.    Allergies  Patient has no known allergies.  Home Medications   Prior to Admission medications   Medication Sig Start Date End Date Taking? Authorizing Provider  amLODipine (NORVASC) 5 MG tablet Take 5 mg by mouth daily.   Yes Historical Provider, MD  hydrochlorothiazide (HYDRODIURIL) 25 MG tablet Take 25 mg by mouth daily.   Yes Historical Provider, MD  lisinopril (PRINIVIL,ZESTRIL) 20 MG tablet Take 20 mg by mouth daily.   Yes Historical Provider, MD  pantoprazole (PROTONIX) 40  MG tablet Take 40 mg by mouth daily.   Yes Historical Provider, MD  sucralfate (CARAFATE) 1 g tablet Take 1 tablet (1 g total) by mouth 4 (four) times daily -  with meals and at bedtime. 12/31/15  Yes Danelle BerryLeisa Tapia, PA-C  aluminum sulfate-calcium acetate (DOMEBORO) packet Apply 1 packet topically 3 (three) times daily. Patient not taking: Reported on 12/30/2015 01/16/14   Graylon GoodZachary H Baker, PA-C  clindamycin (CLINDAGEL) 1 % gel Apply topically 2 (two) times daily. Patient not taking: Reported on 12/30/2015 01/16/14   Graylon GoodZachary H Baker, PA-C  fluconazole (DIFLUCAN) 100 MG tablet Take 1 tablet (100 mg total) by mouth daily. Patient not taking: Reported on 12/30/2015 01/16/14   Graylon GoodZachary H Baker, PA-C  ketoconazole (NIZORAL) 2 % cream Apply 1 application topically 3 (three) times daily. Patient not taking: Reported on 12/30/2015 12/23/13   Ethelda ChickKristi M Smith, MD  terbinafine (LAMISIL) 250 MG tablet Take 1 tablet (250 mg total) by mouth daily. Patient not taking: Reported on 12/30/2015 12/22/13   Peyton Najjaravid H Hopper, MD  triamcinolone cream (KENALOG) 0.1 % Apply 1 application topically 2 (two) times daily. To itching areas only. Patient not taking: Reported on 12/30/2015 12/30/13   Shade FloodJeffrey R Greene, MD   Meds Ordered and Administered this Visit   Medications  gi cocktail (Maalox,Lidocaine,Donnatal) (30 mLs Oral Given 01/04/16 1721)    BP 110/82 (  BP Location: Left Arm)   Pulse 93   Temp 98.7 F (37.1 C) (Oral)   Resp 16   SpO2 100%  No data found.   Physical Exam  Constitutional: He is oriented to person, place, and time. He appears well-developed and well-nourished. No distress.  Abdominal: Soft. Bowel sounds are normal. He exhibits no distension and no mass. There is no tenderness. There is no rebound and no guarding.  Neurological: He is alert and oriented to person, place, and time.  Skin: Skin is warm and dry. He is not diaphoretic.  Nursing note and vitals reviewed.   Urgent Care Course   Clinical  Course     Procedures (including critical care time)  Labs Review Labs Reviewed - No data to display  Imaging Review No results found.   Visual Acuity Review  Right Eye Distance:   Left Eye Distance:   Bilateral Distance:    Right Eye Near:   Left Eye Near:    Bilateral Near:         MDM   1. Gastroesophageal reflux disease, esophagitis presence not specified    Patient's symptoms are c/w ongoing GERD, perhaps H. Pylori, but will leave this evaluation to Dr. Elnoria HowardHung who has anticipated an EGD. He has no new or concerning symptoms for other pathology of his ongoing reflux. Recent ED work up has been non-revealing. Will treat acutely with GI cocktail and have him continue with current regimen. Instructed to call GI tomorrow for further medication management. Patient is stable and ready for D/C.     Riki SheerMichelle G Young, PA-C 01/04/16 1729

## 2016-01-04 NOTE — ED Triage Notes (Addendum)
[facility]. He states that the pain starts in his stomach and goes up, he states it is a burning type of pain. He states he has acid reflux medication but does not help. Patient denies any nausea or vomiting.

## 2016-01-08 ENCOUNTER — Emergency Department (HOSPITAL_COMMUNITY)
Admission: EM | Admit: 2016-01-08 | Discharge: 2016-01-08 | Disposition: A | Discharge status: Home / Self Care | Payer: 59 | Attending: Emergency Medicine | Admitting: Emergency Medicine

## 2016-01-08 ENCOUNTER — Encounter (HOSPITAL_COMMUNITY): Payer: Self-pay | Admitting: *Deleted

## 2016-01-08 DIAGNOSIS — F17210 Nicotine dependence, cigarettes, uncomplicated: Secondary | ICD-10-CM | POA: Diagnosis not present

## 2016-01-08 DIAGNOSIS — K219 Gastro-esophageal reflux disease without esophagitis: Secondary | ICD-10-CM | POA: Diagnosis not present

## 2016-01-08 DIAGNOSIS — R1013 Epigastric pain: Secondary | ICD-10-CM | POA: Diagnosis present

## 2016-01-08 LAB — CBC WITH DIFFERENTIAL/PLATELET
BASOS PCT: 0 %
Basophils Absolute: 0 10*3/uL (ref 0.0–0.1)
EOS ABS: 0.4 10*3/uL (ref 0.0–0.7)
Eosinophils Relative: 3 %
HEMATOCRIT: 44.6 % (ref 39.0–52.0)
Hemoglobin: 15 g/dL (ref 13.0–17.0)
Lymphocytes Relative: 55 %
Lymphs Abs: 6 10*3/uL — ABNORMAL HIGH (ref 0.7–4.0)
MCH: 25.5 pg — ABNORMAL LOW (ref 26.0–34.0)
MCHC: 33.6 g/dL (ref 30.0–36.0)
MCV: 75.7 fL — ABNORMAL LOW (ref 78.0–100.0)
MONOS PCT: 7 %
Monocytes Absolute: 0.8 10*3/uL (ref 0.1–1.0)
NEUTROS ABS: 3.8 10*3/uL (ref 1.7–7.7)
Neutrophils Relative %: 35 %
Platelets: 317 10*3/uL (ref 150–400)
RBC: 5.89 MIL/uL — ABNORMAL HIGH (ref 4.22–5.81)
RDW: 14.4 % (ref 11.5–15.5)
WBC: 10.9 10*3/uL — ABNORMAL HIGH (ref 4.0–10.5)

## 2016-01-08 LAB — COMPREHENSIVE METABOLIC PANEL
ALBUMIN: 4.3 g/dL (ref 3.5–5.0)
ALT: 21 U/L (ref 17–63)
AST: 19 U/L (ref 15–41)
Alkaline Phosphatase: 51 U/L (ref 38–126)
Anion gap: 10 (ref 5–15)
BUN: 12 mg/dL (ref 6–20)
CO2: 26 mmol/L (ref 22–32)
Calcium: 9.9 mg/dL (ref 8.9–10.3)
Chloride: 103 mmol/L (ref 101–111)
Creatinine, Ser: 0.99 mg/dL (ref 0.61–1.24)
GFR calc Af Amer: >60 mL/min (ref >60)
GLUCOSE: 126 mg/dL — AB (ref 65–99)
POTASSIUM: 3.3 mmol/L — AB (ref 3.5–5.1)
Sodium: 139 mmol/L (ref 135–145)
Total Bilirubin: 1.1 mg/dL (ref 0.3–1.2)
Total Protein: 7.2 g/dL (ref 6.5–8.1)

## 2016-01-08 LAB — URINALYSIS, ROUTINE W REFLEX MICROSCOPIC
Glucose, UA: NEGATIVE mg/dL
Ketones, ur: NEGATIVE mg/dL
Leukocytes, UA: NEGATIVE
NITRITE: NEGATIVE
PROTEIN: NEGATIVE mg/dL
Specific Gravity, Urine: 1.031 — ABNORMAL HIGH (ref 1.005–1.030)
pH: 6 (ref 5.0–8.0)

## 2016-01-08 LAB — URINE MICROSCOPIC-ADD ON

## 2016-01-08 LAB — LIPASE, BLOOD: LIPASE: 30 U/L (ref 11–51)

## 2016-01-08 MED ORDER — PANTOPRAZOLE SODIUM 40 MG IV SOLR
40.0000 mg | Freq: Once | INTRAVENOUS | Status: AC
  Administered 2016-01-08: 40 mg via INTRAVENOUS
  Filled 2016-01-08: qty 40

## 2016-01-08 MED ORDER — SODIUM CHLORIDE 0.9 % IV BOLUS (SEPSIS)
500.0000 mL | Freq: Once | INTRAVENOUS | Status: AC
  Administered 2016-01-08: 500 mL via INTRAVENOUS

## 2016-01-08 MED ORDER — FAMOTIDINE IN NACL 20-0.9 MG/50ML-% IV SOLN
20.0000 mg | Freq: Once | INTRAVENOUS | Status: AC
  Administered 2016-01-08: 20 mg via INTRAVENOUS
  Filled 2016-01-08: qty 50

## 2016-01-08 NOTE — Discharge Instructions (Signed)
Keep your appointment today with Dr. Elnoria HowardHung. Utilize this specialist for any future management of this complaint.

## 2016-01-08 NOTE — ED Provider Notes (Signed)
[facility]-EMERGENCY DEPT Provider Note   CSN: [id number] Arrival date & time: 01/08/16  0518     History   Chief Complaint Chief Complaint  Patient presents with  . Abdominal Pain    HPI Earl Levine is a 45 y.o. male.  HPI   Earl Levine is a 45 y.o. male, with a history of GERD, presenting to the ED complaining of recurrence of his GERD symptoms. Patient complains of burning pain in the epigastric region beginning yesterday afternoon. Pain is severe and radiates into the chest. Worse with eating and laying supine. Patient states he did not take his Protonix or his Carafate yesterday or today. Patient indicates that he has an appointment around noon today with Earl HawkingPatrick Hung, gastroenterology, for an EGD. Patient states he has to be nothing by mouth. Patient has not tried contacting Dr. Haywood PaoHung's office for this issue. Patient denies fever/chills, N/V/D, RUQ pain, or any other complaints.      Past Medical History:  Diagnosis Date  . GERD (gastroesophageal reflux disease)     Patient Active Problem List   Diagnosis Date Noted  . Blood pressure elevated without history of HTN 12/22/2013  . Tinea cruris 12/22/2013  . Gastroesophageal reflux disease without esophagitis 12/22/2013  . Annual physical exam 06/05/2013  . GERD (gastroesophageal reflux disease) 06/05/2013    Past Surgical History:  Procedure Laterality Date  . NO PAST SURGERIES         Home Medications    Prior to Admission medications   Medication Sig Start Date End Date Taking? Authorizing Provider  amLODipine (NORVASC) 5 MG tablet Take 5 mg by mouth daily.   Yes Historical Provider, MD  hydrochlorothiazide (HYDRODIURIL) 25 MG tablet Take 25 mg by mouth daily.   Yes Historical Provider, MD  lisinopril (PRINIVIL,ZESTRIL) 20 MG tablet Take 20 mg by mouth daily.   Yes Historical Provider, MD  pantoprazole (PROTONIX) 40 MG tablet Take 40 mg by mouth daily.   Yes Historical Provider, MD  sucralfate  (CARAFATE) 1 g tablet Take 1 tablet (1 g total) by mouth 4 (four) times daily -  with meals and at bedtime. 12/31/15  Yes Danelle BerryLeisa Tapia, PA-C  aluminum sulfate-calcium acetate (DOMEBORO) packet Apply 1 packet topically 3 (three) times daily. Patient not taking: Reported on 01/08/2016 01/16/14   Graylon GoodZachary H Baker, PA-C  clindamycin (CLINDAGEL) 1 % gel Apply topically 2 (two) times daily. Patient not taking: Reported on 01/08/2016 01/16/14   Graylon GoodZachary H Baker, PA-C  fluconazole (DIFLUCAN) 100 MG tablet Take 1 tablet (100 mg total) by mouth daily. Patient not taking: Reported on 01/08/2016 01/16/14   Graylon GoodZachary H Baker, PA-C  ketoconazole (NIZORAL) 2 % cream Apply 1 application topically 3 (three) times daily. Patient not taking: Reported on 01/08/2016 12/23/13   Ethelda ChickKristi M Smith, MD  terbinafine (LAMISIL) 250 MG tablet Take 1 tablet (250 mg total) by mouth daily. Patient not taking: Reported on 01/08/2016 12/22/13   Peyton Najjaravid H Hopper, MD  triamcinolone cream (KENALOG) 0.1 % Apply 1 application topically 2 (two) times daily. To itching areas only. Patient not taking: Reported on 01/08/2016 12/30/13   Shade FloodJeffrey R Greene, MD    Family History Family History  Problem Relation Age of Onset  . Colon cancer Neg Hx   . Prostate cancer Neg Hx   . CAD Neg Hx   . Diabetes Neg Hx   . Hypertension Neg Hx     Social History Social History  Substance Use Topics  . Smoking status: Current Every  Day Smoker    Packs/day: 1.00    Types: Cigarettes  . Smokeless tobacco: Current User     Comment: 1/2 ppd  . Alcohol use No     Allergies   Patient has no known allergies.   Review of Systems Review of Systems  Constitutional: Negative for chills and fever.  Respiratory: Negative for shortness of breath.   Gastrointestinal: Positive for abdominal pain. Negative for abdominal distention, blood in stool, diarrhea, nausea and vomiting.  All other systems reviewed and are negative.    Physical Exam Updated Vital  Signs BP 137/91   Pulse 77   Temp 98.4 F (36.9 C) (Oral)   Resp 16   Ht 5\' 11"  (1.803 m)   Wt 101.2 kg   SpO2 100%   BMI 31.10 kg/m   Physical Exam  Constitutional: He appears well-developed and well-nourished. No distress.  HENT:  Head: Normocephalic and atraumatic.  Eyes: Conjunctivae are normal.  Neck: Neck supple.  Cardiovascular: Normal rate, regular rhythm, normal heart sounds and intact distal pulses.   Pulmonary/Chest: Effort normal and breath sounds normal. No respiratory distress.  Abdominal: Soft. There is no tenderness. There is no guarding.  Musculoskeletal: He exhibits no edema.  Lymphadenopathy:    He has no cervical adenopathy.  Neurological: He is alert.  Skin: Skin is warm and dry. He is not diaphoretic.  Psychiatric: He has a normal mood and affect. His behavior is normal.  Nursing note and vitals reviewed.    ED Treatments / Results  Labs (all labs ordered are listed, but only abnormal results are displayed) Labs Reviewed  CBC WITH DIFFERENTIAL/PLATELET - Abnormal; Notable for the following:       Result Value   WBC 10.9 (*)    RBC 5.89 (*)    MCV 75.7 (*)    MCH 25.5 (*)    Lymphs Abs 6.0 (*)    All other components within normal limits  COMPREHENSIVE METABOLIC PANEL - Abnormal; Notable for the following:    Potassium 3.3 (*)    Glucose, Bld 126 (*)    All other components within normal limits  URINALYSIS, ROUTINE W REFLEX MICROSCOPIC (NOT AT [facility]) - Abnormal; Notable for the following:    Color, Urine AMBER (*)    APPearance CLOUDY (*)    Specific Gravity, Urine 1.031 (*)    Hgb urine dipstick SMALL (*)    Bilirubin Urine SMALL (*)    All other components within normal limits  URINE MICROSCOPIC-ADD ON - Abnormal; Notable for the following:    Squamous Epithelial / LPF 0-5 (*)    Bacteria, UA RARE (*)    All other components within normal limits  LIPASE, BLOOD    EKG  EKG Interpretation None       Radiology No results  found.  Procedures Procedures (including critical care time)  Medications Ordered in ED Medications  famotidine (PEPCID) IVPB 20 mg premix (0 mg Intravenous Stopped 01/08/16 0735)  pantoprazole (PROTONIX) injection 40 mg (40 mg Intravenous Given 01/08/16 0640)  sodium chloride 0.9 % bolus 500 mL (0 mLs Intravenous Stopped 01/08/16 0806)     Initial Impression / Assessment and Plan / ED Course  I have reviewed the triage vital signs and the nursing notes.  Pertinent labs & imaging results that were available during my care of the patient were reviewed by me and considered in my medical decision making (see chart for details).  Clinical Course     Patient presents with epigastric  pain beginning yesterday. Suspect exacerbation of patient's GERD symptoms, likely because he has not taken his medication. Patient already has appropriate very close follow-up with a specialist. Patient is nontoxic appearing and vital signs are stable within normal limits. No symptoms suggestive of more serious issues such as esophageal perforation. Patient to follow-up with Dr. Elnoria Howard this morning. Return precautions discussed.  Vitals:   01/08/16 0520 01/08/16 0525 01/08/16 0615 01/08/16 0621  BP: 125/80  137/91   Pulse: 85  77   Resp: 16     Temp: 98.4 F (36.9 C)     TempSrc: Oral     SpO2: 100%  100% 100%  Weight: 101.2 kg 101.2 kg    Height: 5\' 11"  (1.803 m)         Final Clinical Impressions(s) / ED Diagnoses   Final diagnoses:  Chronic GERD    New Prescriptions New Prescriptions   No medications on file     Anselm Pancoast, PA-C 01/08/16 0820    Glynn Octave, MD 01/08/16 [phone]

## 2016-01-08 NOTE — ED Triage Notes (Signed)
Patient presents with c/o burning pain to the upper abd

## 2016-05-17 ENCOUNTER — Encounter (HOSPITAL_COMMUNITY): Payer: Self-pay | Admitting: *Deleted

## 2016-05-17 ENCOUNTER — Ambulatory Visit (HOSPITAL_COMMUNITY)
Admission: EM | Admit: 2016-05-17 | Discharge: 2016-05-17 | Disposition: A | Discharge status: Home / Self Care | Payer: 59 | Attending: Family Medicine | Admitting: Family Medicine

## 2016-05-17 DIAGNOSIS — B370 Candidal stomatitis: Secondary | ICD-10-CM | POA: Diagnosis not present

## 2016-05-17 DIAGNOSIS — I160 Hypertensive urgency: Secondary | ICD-10-CM | POA: Diagnosis not present

## 2016-05-17 DIAGNOSIS — K219 Gastro-esophageal reflux disease without esophagitis: Secondary | ICD-10-CM | POA: Diagnosis not present

## 2016-05-17 MED ORDER — NYSTATIN 100000 UNIT/ML MT SUSP: 500000.0000 [IU] | Freq: Four times a day (QID) | OROMUCOSAL | 0 refills | Status: AC

## 2016-05-17 MED ORDER — CLONIDINE HCL 0.1 MG PO TABS
ORAL_TABLET | ORAL | Status: AC
  Filled 2016-05-17: qty 1

## 2016-05-17 MED ORDER — GI COCKTAIL ~~LOC~~
30.0000 mL | Freq: Once | ORAL | Status: AC
  Administered 2016-05-17: 30 mL via ORAL

## 2016-05-17 MED ORDER — FLUCONAZOLE 150 MG PO TABS: 150.0000 mg | ORAL_TABLET | Freq: Once | ORAL | 0 refills | Status: AC

## 2016-05-17 MED ORDER — GI COCKTAIL ~~LOC~~
ORAL | Status: AC
  Filled 2016-05-17: qty 30

## 2016-05-17 MED ORDER — SUCRALFATE 1 G PO TABS: 1.0000 g | ORAL_TABLET | Freq: Three times a day (TID) | ORAL | 0 refills | Status: AC

## 2016-05-17 MED ORDER — CLONIDINE HCL 0.1 MG PO TABS
0.1000 mg | ORAL_TABLET | Freq: Once | ORAL | Status: AC
Start: 2016-05-17 — End: 2016-05-17
  Administered 2016-05-17: 0.1 mg via ORAL

## 2016-05-17 NOTE — Discharge Instructions (Signed)
Please advise patient to take all 3 BP medications as prescribed. Keep BP log x 2 weeks and FU with PCP for further evaluation and recommendations. FU PCP for Gastroenterology referral for GERD management. Advise to take Zantac 300 mg QHS and Omeprazole 40 mg 30 minutes before breakfast.

## 2016-05-17 NOTE — ED Provider Notes (Signed)
CSN: [id number]     Arrival date & time 05/17/16  1002 History   First MD Initiated Contact with Patient 05/17/16 1108     Chief Complaint  Patient presents with  . Heartburn   (Consider location/radiation/quality/duration/timing/severity/associated sxs/prior Treatment) 46 yo male with H/O HTN and GERD presented with CC of epigastric discomfort x 4 days. Pt reports feels acid coming up to mouth with burning sensation in the chest. Upon arrival BP elevated 170/119. Denies CP, SOB, palpitations. Denies change in vision or blurry vision.Pt states taking Lisinopril with no improvement in BP. Pt reports does not take medication on regular basis as it is not help to lower BP. Pt non compliant with BP and GERD medication. Will do EKG to R/O ACS vs GERD. Also c/o thick white layering on tongue. Painful when using tooth brush to clean. Have seen GI for GERD and Oral thrush x 2 months ago. Reports Endoscopy was done and Given Nystatin oral with no relief in Sx. Pt A&Ox3. No acute signs of visible distress  Heartburn  Associated symptoms include abdominal pain (Epigastric pain ). Pertinent negatives include no chest pain, no headaches and no shortness of breath.    Past Medical History:  Diagnosis Date  . GERD (gastroesophageal reflux disease)    Past Surgical History:  Procedure Laterality Date  . NO PAST SURGERIES     Family History  Problem Relation Age of Onset  . Colon cancer Neg Hx   . Prostate cancer Neg Hx   . CAD Neg Hx   . Diabetes Neg Hx   . Hypertension Neg Hx    Social History  Substance Use Topics  . Smoking status: Current Every Day Smoker    Packs/day: 1.00    Types: Cigarettes  . Smokeless tobacco: Current User     Comment: 1/2 ppd  . Alcohol use No    Review of Systems  Constitutional: Negative for activity change, appetite change, diaphoresis and fever.  HENT: Positive for mouth sores (Thick white deposit on tongue). Negative for congestion.   Respiratory: Positive  for cough. Negative for apnea, chest tightness, shortness of breath and wheezing.   Cardiovascular: Negative for chest pain, palpitations and leg swelling.  Gastrointestinal: Positive for abdominal pain (Epigastric pain ) and heartburn. Negative for nausea and vomiting.  Neurological: Negative for dizziness, light-headedness, numbness and headaches.    Allergies  Patient has no known allergies.  Home Medications   Prior to Admission medications   Medication Sig Start Date End Date Taking? Authorizing Provider  amLODipine (NORVASC) 5 MG tablet Take 5 mg by mouth daily.    Historical Provider, MD  hydrochlorothiazide (HYDRODIURIL) 25 MG tablet Take 25 mg by mouth daily.    Historical Provider, MD  lisinopril (PRINIVIL,ZESTRIL) 20 MG tablet Take 20 mg by mouth daily.    Historical Provider, MD  pantoprazole (PROTONIX) 40 MG tablet Take 40 mg by mouth daily.    Historical Provider, MD  sucralfate (CARAFATE) 1 g tablet Take 1 tablet (1 g total) by mouth 4 (four) times daily -  with meals and at bedtime. 12/31/15   Danelle BerryLeisa Tapia, PA-C  terbinafine (LAMISIL) 250 MG tablet Take 1 tablet (250 mg total) by mouth daily. Patient not taking: Reported on 01/08/2016 12/22/13   Peyton Najjaravid H Hopper, MD  triamcinolone cream (KENALOG) 0.1 % Apply 1 application topically 2 (two) times daily. To itching areas only. Patient not taking: Reported on 01/08/2016 12/30/13   Shade FloodJeffrey R Greene, MD   Meds Ordered  and Administered this Visit   Medications  gi cocktail (Maalox,Lidocaine,Donnatal) (30 mLs Oral Given 05/17/16 1126)  cloNIDine (CATAPRES) tablet 0.1 mg (0.1 mg Oral Given 05/17/16 1126)    BP (!) 170/119 (BP Location: Right Arm)   Pulse 78   Temp 98.6 F (37 C) (Oral)   Resp 18   SpO2 100%  No data found.   Physical Exam  Constitutional: He is oriented to person, place, and time. He appears well-developed and well-nourished. No distress.  HENT:  Head: Normocephalic.  Mouth/Throat: Oropharyngeal exudate  (Thick white patch to tongue) present.  Eyes: Pupils are equal, round, and reactive to light.  Neck: Neck supple.  Cardiovascular: Normal rate, regular rhythm and normal heart sounds.   Pulmonary/Chest: Effort normal and breath sounds normal. No respiratory distress. He has no rales. He exhibits no tenderness.  Abdominal: Soft. Bowel sounds are normal. He exhibits no mass. There is tenderness (Epigastric pain and tenderness to deep palpation). There is no rebound and no guarding. No hernia.  Neurological: He is alert and oriented to person, place, and time. No cranial nerve deficit or sensory deficit. Coordination normal.  Skin: Skin is warm.    Urgent Care Course     Procedures (including critical care time)  Labs Review Labs Reviewed - No data to display  Imaging Review No results found.   Visual Acuity Review  Right Eye Distance:   Left Eye Distance:   Bilateral Distance:    Right Eye Near:   Left Eye Near:    Bilateral Near:    EKG: {ekg findings:315101::"normal EKG, normal sinus rhythm with sinus arrythmia",<ECG> MDM   1. Hypertensive urgency   2. Gastroesophageal reflux disease, esophagitis presence not specified   3. Thrush, oral   Pt is non complaint with HTN medication. Pt reports only takes Lisinopril while he is  Also on Amlodipine and HCTZ. Pt educated on importance of taking medications as prescribed. Pt states do not need refills on BP medications and have all 3 medications at home. Pt encouraged to take all 3 meds and if BP is still elevated FU with PCP for further evaluation and management. Educated on smoking cessation and diet modification.     Jayla Mackie, NP 05/17/16 1156    Bradlee Heitman, NP 05/17/16 1215

## 2016-05-17 NOTE — ED Triage Notes (Signed)
Pt    Reports     Heartburn     And  Epigastric    Pain         With   Cough  X    4  Days         Pt  Has  A  History  Of  gerd         And  Reports  The  Symptoms  Not  releived  By  presecribed  meds      Pt is  Awake  And  ak=lert  And  Oriented    heis  bp  ie  Elevated   And  He  Has  Not  Been on  His  meds

## 2016-07-05 ENCOUNTER — Other Ambulatory Visit: Payer: Self-pay | Admitting: Gastroenterology

## 2016-08-13 ENCOUNTER — Ambulatory Visit (HOSPITAL_COMMUNITY)
Admission: RE | Admit: 2016-08-13 | Discharge: 2016-08-13 | Disposition: A | Discharge status: Home / Self Care | Payer: 59 | Source: Ambulatory Visit | Attending: Gastroenterology | Admitting: Gastroenterology

## 2016-08-13 ENCOUNTER — Encounter (HOSPITAL_COMMUNITY)
Admission: RE | Disposition: A | Discharge status: Home / Self Care | Payer: Self-pay | Source: Ambulatory Visit | Attending: Gastroenterology

## 2016-08-13 DIAGNOSIS — F17210 Nicotine dependence, cigarettes, uncomplicated: Secondary | ICD-10-CM | POA: Diagnosis not present

## 2016-08-13 DIAGNOSIS — K219 Gastro-esophageal reflux disease without esophagitis: Secondary | ICD-10-CM | POA: Diagnosis present

## 2016-08-13 HISTORY — PX: ESOPHAGEAL MANOMETRY: SHX5429

## 2016-08-13 HISTORY — PX: PH IMPEDANCE STUDY: SHX5565

## 2016-08-13 SURGERY — MANOMETRY, ESOPHAGUS

## 2016-08-13 MED ORDER — LIDOCAINE VISCOUS 2 % MT SOLN
OROMUCOSAL | Status: AC
  Filled 2016-08-13: qty 15

## 2016-08-13 SURGICAL SUPPLY — 2 items
FACESHIELD LNG OPTICON STERILE (SAFETY) IMPLANT
GLOVE BIO SURGEON STRL SZ8 (GLOVE) ×4 IMPLANT

## 2016-08-13 NOTE — H&P (Signed)
Earl Levine HPI: This is a 46 year old male with persistent GERD in spite of treatment.  He is here today for an manometry and impedence pH testing.  Past Medical History:  Diagnosis Date  . GERD (gastroesophageal reflux disease)     Past Surgical History:  Procedure Laterality Date  . NO PAST SURGERIES      Family History  Problem Relation Age of Onset  . Colon cancer Neg Hx   . Prostate cancer Neg Hx   . CAD Neg Hx   . Diabetes Neg Hx   . Hypertension Neg Hx     Social History:  reports that he has been smoking Cigarettes.  He has been smoking about 1.00 pack per day. He uses smokeless tobacco. He reports that he does not drink alcohol or use drugs.  Allergies: No Known Allergies  Medications: Scheduled: Continuous:  No results found for this or any previous visit (from the past 24 hour(s)).   No results found.  ROS:  As stated above in the HPI otherwise negative.  There were no vitals taken for this visit.    PE: Nursing procedure.  No physical examination performed.  Assessment/Plan: 1) GERD - Manometry and Impedence pH.  Isobelle Tuckett D 08/13/2016, 1:23 PM

## 2016-08-13 NOTE — Progress Notes (Signed)
Esophageal Manometry done per protocol. Pt tolerated well without complication. 24 hour PH with impedance probe placed without difficulty or complication.  Pt instructed using teach back on study and ph monitor. Pt voiced understanding and that study will last til 11:30 on 08/14/2016. Pt instructed to return monitor to endoscopy unit. Pt voiced understanding and phone numbers given for questions

## 2016-08-16 ENCOUNTER — Encounter (HOSPITAL_COMMUNITY): Payer: Self-pay | Admitting: Gastroenterology

## 2017-01-31 IMAGING — CR DG ABDOMEN ACUTE W/ 1V CHEST
3 series · 3 of 3 positions shown · non-contrast
Comparison: None.

CLINICAL DATA: Mid upper abdominal pain and mid lower chest pain.
High blood pressure tonight.

EXAM:
DG ABDOMEN ACUTE W/ 1V CHEST

[chest pa]
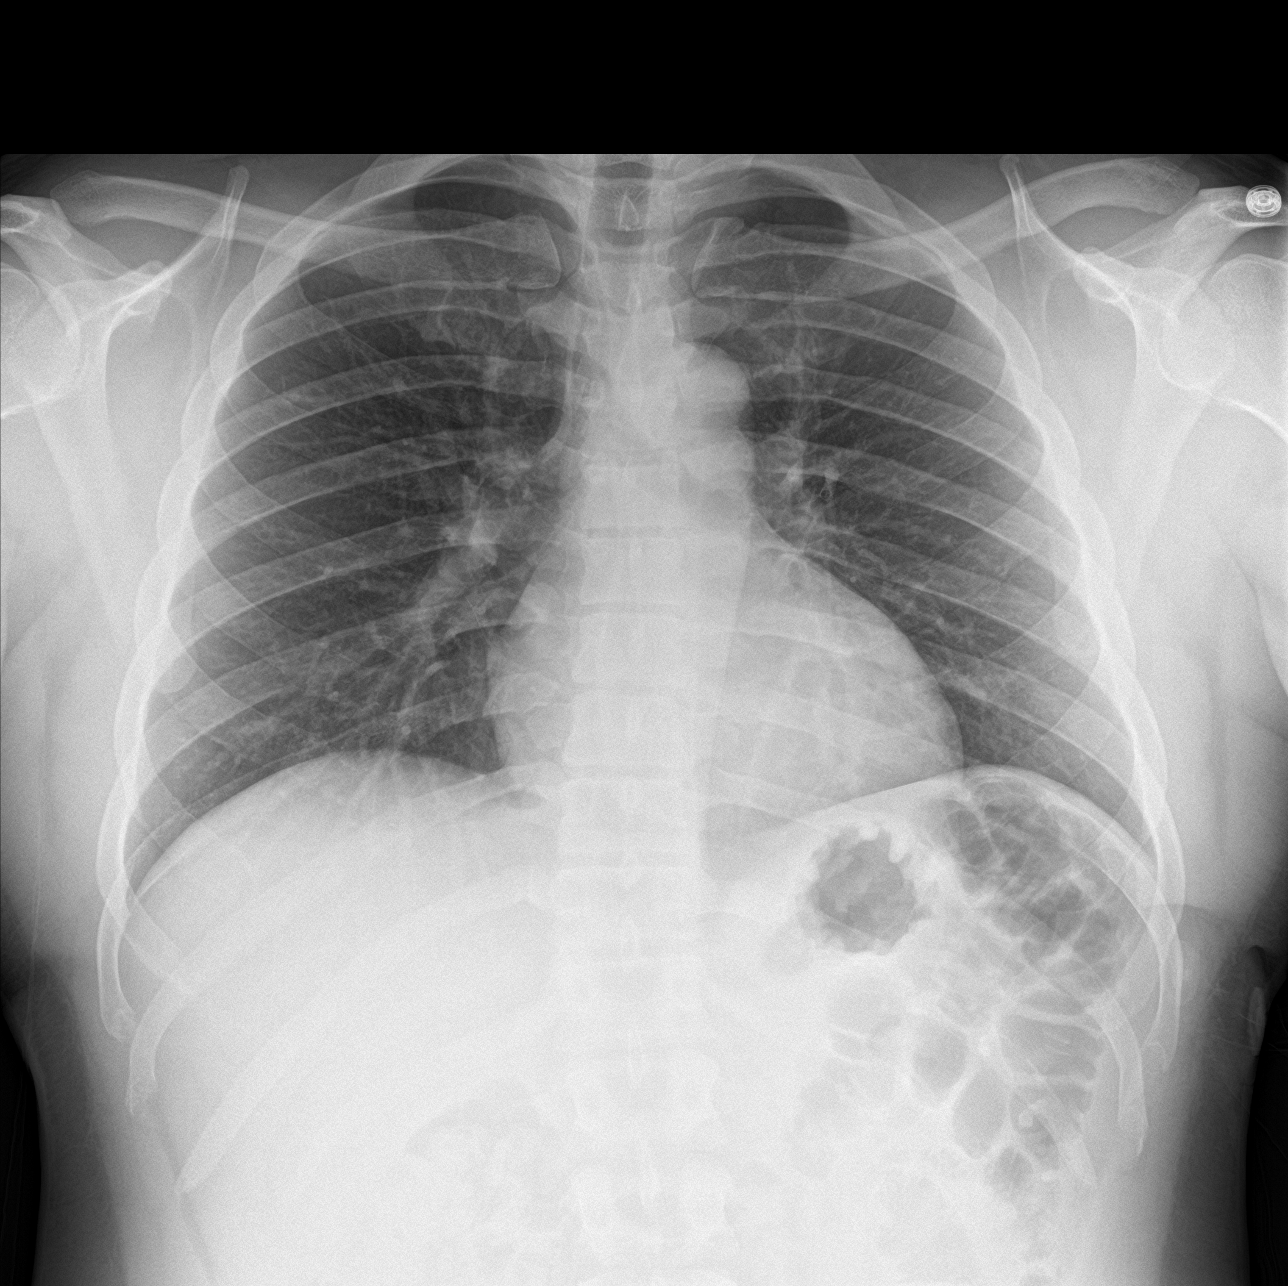

[abdomen erect]
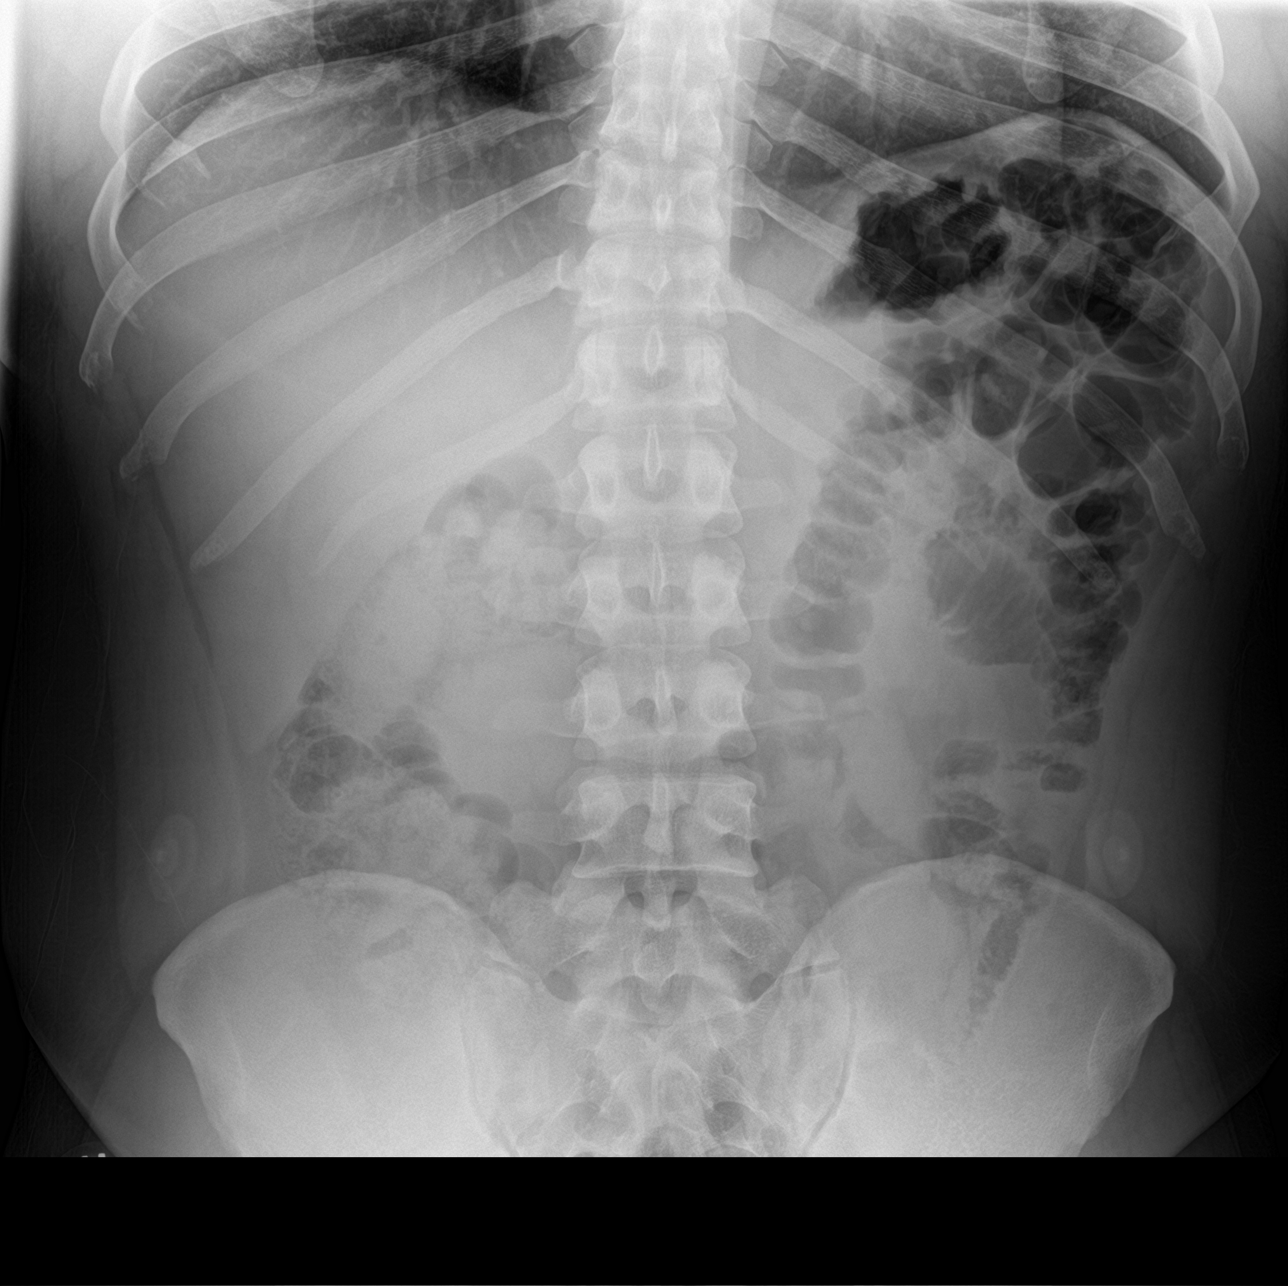

[abdomen supine]
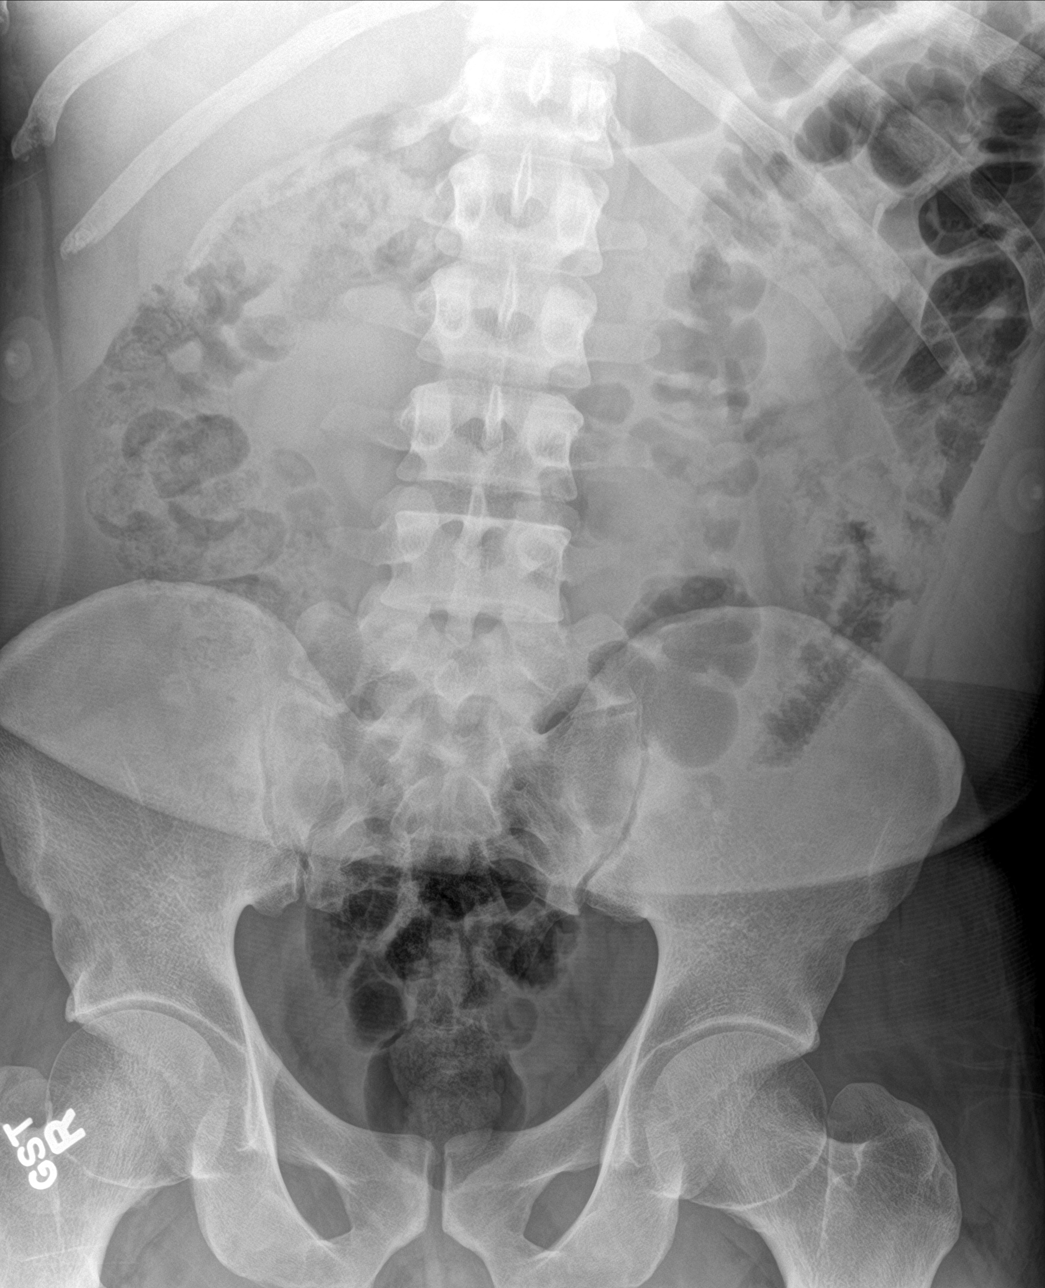

[3 of 3 positions shown; findings below may reference images not displayed]

FINDINGS: Normal heart size and pulmonary vascularity. No focal airspace
disease or consolidation in the lungs. No blunting of costophrenic
angles. No pneumothorax. Mediastinal contours appear intact.

Scattered gas and stool throughout the colon with gas in a few small
bowel loops. No small or large bowel distention is identified. No
free intra-abdominal air. No abnormal air-fluid levels. No
radiopaque stones. Visualized bones appear intact.
IMPRESSION: No evidence of active pulmonary disease. Nonobstructive bowel gas
pattern with scattered stool throughout the colon.
